# Patient Record
Sex: Female | Born: 1960 | Hispanic: Yes | Marital: Married | Smoking: Never smoker
Health system: Southern US, Community
[De-identification: ages and names within clinical notes are randomized; demographics above are authoritative.]

## PROBLEM LIST (undated history)

## (undated) DIAGNOSIS — R7303 Prediabetes: Secondary | ICD-10-CM

---

## 2015-01-22 DIAGNOSIS — H40023 Open angle with borderline findings, high risk, bilateral: Secondary | ICD-10-CM | POA: Insufficient documentation

## 2015-12-26 DIAGNOSIS — R7303 Prediabetes: Secondary | ICD-10-CM | POA: Insufficient documentation

## 2018-12-19 DIAGNOSIS — F5101 Primary insomnia: Secondary | ICD-10-CM | POA: Insufficient documentation

## 2018-12-19 HISTORY — DX: Primary insomnia: F51.01

## 2020-06-05 ENCOUNTER — Other Ambulatory Visit: Payer: Self-pay

## 2020-06-05 ENCOUNTER — Emergency Department: Payer: 59

## 2020-06-05 ENCOUNTER — Encounter: Payer: Self-pay | Admitting: Emergency Medicine

## 2020-06-05 DIAGNOSIS — J9601 Acute respiratory failure with hypoxia: Secondary | ICD-10-CM | POA: Diagnosis present

## 2020-06-05 DIAGNOSIS — Z283 Underimmunization status: Secondary | ICD-10-CM

## 2020-06-05 DIAGNOSIS — U071 COVID-19: Secondary | ICD-10-CM | POA: Diagnosis not present

## 2020-06-05 DIAGNOSIS — Z83511 Family history of glaucoma: Secondary | ICD-10-CM

## 2020-06-05 DIAGNOSIS — R7303 Prediabetes: Secondary | ICD-10-CM | POA: Diagnosis present

## 2020-06-05 DIAGNOSIS — Z79899 Other long term (current) drug therapy: Secondary | ICD-10-CM

## 2020-06-05 DIAGNOSIS — D696 Thrombocytopenia, unspecified: Secondary | ICD-10-CM | POA: Diagnosis present

## 2020-06-05 DIAGNOSIS — J1282 Pneumonia due to coronavirus disease 2019: Secondary | ICD-10-CM | POA: Diagnosis present

## 2020-06-05 DIAGNOSIS — Z833 Family history of diabetes mellitus: Secondary | ICD-10-CM

## 2020-06-05 DIAGNOSIS — H409 Unspecified glaucoma: Secondary | ICD-10-CM | POA: Diagnosis present

## 2020-06-05 LAB — CBC WITH DIFFERENTIAL/PLATELET
Abs Immature Granulocytes: 0.02 10*3/uL (ref 0.00–0.07)
Basophils Absolute: 0 10*3/uL (ref 0.0–0.1)
Basophils Relative: 0 %
Eosinophils Absolute: 0 10*3/uL (ref 0.0–0.5)
Eosinophils Relative: 0 %
HCT: 39.3 % (ref 36.0–46.0)
Hemoglobin: 13.3 g/dL (ref 12.0–15.0)
Immature Granulocytes: 0 %
Lymphocytes Relative: 33 %
Lymphs Abs: 1.5 10*3/uL (ref 0.7–4.0)
MCH: 30 pg (ref 26.0–34.0)
MCHC: 33.8 g/dL (ref 30.0–36.0)
MCV: 88.7 fL (ref 80.0–100.0)
Monocytes Absolute: 0.3 10*3/uL (ref 0.1–1.0)
Monocytes Relative: 7 %
Neutro Abs: 2.6 10*3/uL (ref 1.7–7.7)
Neutrophils Relative %: 60 %
Platelets: 143 10*3/uL — ABNORMAL LOW (ref 150–400)
RBC: 4.43 MIL/uL (ref 3.87–5.11)
RDW: 11.9 % (ref 11.5–15.5)
WBC: 4.5 10*3/uL (ref 4.0–10.5)
nRBC: 0 % (ref 0.0–0.2)

## 2020-06-05 LAB — COMPREHENSIVE METABOLIC PANEL
ALT: 28 U/L (ref 0–44)
AST: 38 U/L (ref 15–41)
Albumin: 3.6 g/dL (ref 3.5–5.0)
Alkaline Phosphatase: 59 U/L (ref 38–126)
Anion gap: 11 (ref 5–15)
BUN: 13 mg/dL (ref 6–20)
CO2: 30 mmol/L (ref 22–32)
Calcium: 9 mg/dL (ref 8.9–10.3)
Chloride: 97 mmol/L — ABNORMAL LOW (ref 98–111)
Creatinine, Ser: 0.69 mg/dL (ref 0.44–1.00)
GFR calc Af Amer: >60 mL/min (ref >60)
GFR calc non Af Amer: >60 mL/min (ref >60)
Glucose, Bld: 108 mg/dL — ABNORMAL HIGH (ref 70–99)
Potassium: 3.8 mmol/L (ref 3.5–5.1)
Sodium: 138 mmol/L (ref 135–145)
Total Bilirubin: 0.8 mg/dL (ref 0.3–1.2)
Total Protein: 7 g/dL (ref 6.5–8.1)

## 2020-06-05 LAB — TROPONIN I (HIGH SENSITIVITY): Troponin I (High Sensitivity): 4 ng/L (ref <18)

## 2020-06-05 NOTE — ED Triage Notes (Addendum)
Pt presents to ED with chest pain and right sided back pain with frequent cough. Fever does not improve with otc medication. Dx with COVID aug 30. Pt reports feeling so weak she cant walk around and dizzy when she attempts to walk around her house.

## 2020-06-06 ENCOUNTER — Inpatient Hospital Stay
Admission: EM | Admit: 2020-06-06 | Discharge: 2020-06-08 | DRG: 177 | Disposition: A | Discharge status: Home / Self Care | Payer: 59 | Attending: Internal Medicine | Admitting: Internal Medicine

## 2020-06-06 ENCOUNTER — Encounter: Payer: Self-pay | Admitting: Family Medicine

## 2020-06-06 ENCOUNTER — Inpatient Hospital Stay: Payer: 59

## 2020-06-06 DIAGNOSIS — R7303 Prediabetes: Secondary | ICD-10-CM | POA: Diagnosis present

## 2020-06-06 DIAGNOSIS — Z833 Family history of diabetes mellitus: Secondary | ICD-10-CM | POA: Diagnosis not present

## 2020-06-06 DIAGNOSIS — H4010X1 Unspecified open-angle glaucoma, mild stage: Secondary | ICD-10-CM

## 2020-06-06 DIAGNOSIS — U071 COVID-19: Secondary | ICD-10-CM | POA: Diagnosis present

## 2020-06-06 DIAGNOSIS — D696 Thrombocytopenia, unspecified: Secondary | ICD-10-CM | POA: Diagnosis present

## 2020-06-06 DIAGNOSIS — Z83511 Family history of glaucoma: Secondary | ICD-10-CM | POA: Diagnosis not present

## 2020-06-06 DIAGNOSIS — Z283 Underimmunization status: Secondary | ICD-10-CM | POA: Diagnosis not present

## 2020-06-06 DIAGNOSIS — J1282 Pneumonia due to coronavirus disease 2019: Secondary | ICD-10-CM

## 2020-06-06 DIAGNOSIS — J9601 Acute respiratory failure with hypoxia: Secondary | ICD-10-CM | POA: Diagnosis present

## 2020-06-06 DIAGNOSIS — H409 Unspecified glaucoma: Secondary | ICD-10-CM | POA: Diagnosis present

## 2020-06-06 DIAGNOSIS — Z79899 Other long term (current) drug therapy: Secondary | ICD-10-CM | POA: Diagnosis not present

## 2020-06-06 HISTORY — DX: Prediabetes: R73.03

## 2020-06-06 LAB — GLUCOSE, CAPILLARY
Glucose-Capillary: 220 mg/dL — ABNORMAL HIGH (ref 70–99)
Glucose-Capillary: 226 mg/dL — ABNORMAL HIGH (ref 70–99)
Glucose-Capillary: 162 mg/dL — ABNORMAL HIGH (ref 70–99)

## 2020-06-06 LAB — LACTATE DEHYDROGENASE: LDH: 276 U/L — ABNORMAL HIGH (ref 98–192)

## 2020-06-06 LAB — HEMOGLOBIN A1C
Hgb A1c MFr Bld: 6.1 % — ABNORMAL HIGH (ref 4.8–5.6)
Mean Plasma Glucose: 128.37 mg/dL

## 2020-06-06 LAB — TROPONIN I (HIGH SENSITIVITY): Troponin I (High Sensitivity): 4 ng/L (ref <18)

## 2020-06-06 LAB — C-REACTIVE PROTEIN: CRP: 4.7 mg/dL — ABNORMAL HIGH (ref <1.0)

## 2020-06-06 LAB — CK: Total CK: 60 U/L (ref 38–234)

## 2020-06-06 LAB — FIBRIN DERIVATIVES D-DIMER (ARMC ONLY): Fibrin derivatives D-dimer (ARMC): 1637.12 ng/mL (FEU) — ABNORMAL HIGH (ref 0.00–499.00)

## 2020-06-06 LAB — HIV ANTIBODY (ROUTINE TESTING W REFLEX): HIV Screen 4th Generation wRfx: NONREACTIVE

## 2020-06-06 LAB — FERRITIN: Ferritin: 481 ng/mL — ABNORMAL HIGH (ref 11–307)

## 2020-06-06 LAB — PROCALCITONIN: Procalcitonin: <0.1 ng/mL

## 2020-06-06 LAB — BRAIN NATRIURETIC PEPTIDE: B Natriuretic Peptide: 30.3 pg/mL (ref 0.0–100.0)

## 2020-06-06 MED ORDER — DEXAMETHASONE SODIUM PHOSPHATE 10 MG/ML IJ SOLN
10.0000 mg | Freq: Once | INTRAMUSCULAR | Status: AC
  Administered 2020-06-06: 10 mg via INTRAVENOUS
  Filled 2020-06-06: qty 1

## 2020-06-06 MED ORDER — FAMOTIDINE 20 MG PO TABS
20.0000 mg | ORAL_TABLET | Freq: Two times a day (BID) | ORAL | Status: DC
  Administered 2020-06-06 – 2020-06-08 (×5): 20 mg via ORAL
  Filled 2020-06-06 (×5): qty 1

## 2020-06-06 MED ORDER — SODIUM CHLORIDE 0.9 % IV SOLN: 200.0000 mg | Freq: Once | INTRAVENOUS | Status: DC

## 2020-06-06 MED ORDER — SODIUM CHLORIDE 0.9 % IV SOLN: 100.0000 mg | Freq: Every day | INTRAVENOUS | Status: DC

## 2020-06-06 MED ORDER — ASPIRIN EC 81 MG PO TBEC
81.0000 mg | DELAYED_RELEASE_TABLET | Freq: Every day | ORAL | Status: DC
  Administered 2020-06-06 – 2020-06-08 (×3): 81 mg via ORAL
  Filled 2020-06-06 (×3): qty 1

## 2020-06-06 MED ORDER — MAGNESIUM HYDROXIDE 400 MG/5ML PO SUSP
30.0000 mL | Freq: Every day | ORAL | Status: DC | PRN
  Filled 2020-06-06: qty 30

## 2020-06-06 MED ORDER — INSULIN ASPART 100 UNIT/ML ~~LOC~~ SOLN
0.0000 [IU] | Freq: Three times a day (TID) | SUBCUTANEOUS | Status: DC
  Administered 2020-06-06: 4 [IU] via SUBCUTANEOUS
  Administered 2020-06-07 (×2): 3 [IU] via SUBCUTANEOUS
  Administered 2020-06-07: 7 [IU] via SUBCUTANEOUS
  Administered 2020-06-08 (×2): 4 [IU] via SUBCUTANEOUS
  Filled 2020-06-06 (×6): qty 1

## 2020-06-06 MED ORDER — SODIUM CHLORIDE 0.9 % IV SOLN
1.0000 g | Freq: Once | INTRAVENOUS | Status: AC
  Administered 2020-06-06: 1 g via INTRAVENOUS
  Filled 2020-06-06: qty 10

## 2020-06-06 MED ORDER — LATANOPROST 0.005 % OP SOLN
1.0000 [drp] | Freq: Every day | OPHTHALMIC | Status: DC
  Filled 2020-06-06: qty 2.5

## 2020-06-06 MED ORDER — VITAMIN D 25 MCG (1000 UNIT) PO TABS
1000.0000 [IU] | ORAL_TABLET | Freq: Every day | ORAL | Status: DC
  Administered 2020-06-06 – 2020-06-08 (×3): 1000 [IU] via ORAL
  Filled 2020-06-06 (×3): qty 1

## 2020-06-06 MED ORDER — LATANOPROST 0.005 % OP SOLN
1.0000 [drp] | Freq: Every day | OPHTHALMIC | Status: DC
  Administered 2020-06-06 – 2020-06-07 (×2): 1 [drp] via OPHTHALMIC
  Filled 2020-06-06: qty 2.5

## 2020-06-06 MED ORDER — ZINC SULFATE 220 (50 ZN) MG PO CAPS
220.0000 mg | ORAL_CAPSULE | Freq: Every day | ORAL | Status: DC
  Administered 2020-06-06 – 2020-06-08 (×3): 220 mg via ORAL
  Filled 2020-06-06 (×3): qty 1

## 2020-06-06 MED ORDER — PREDNISONE 50 MG PO TABS: 50.0000 mg | ORAL_TABLET | Freq: Every day | ORAL | Status: DC

## 2020-06-06 MED ORDER — ENOXAPARIN SODIUM 40 MG/0.4ML ~~LOC~~ SOLN
40.0000 mg | SUBCUTANEOUS | Status: DC
  Administered 2020-06-06 – 2020-06-07 (×3): 40 mg via SUBCUTANEOUS
  Filled 2020-06-06 (×3): qty 0.4

## 2020-06-06 MED ORDER — ASCORBIC ACID 500 MG PO TABS
500.0000 mg | ORAL_TABLET | Freq: Every day | ORAL | Status: DC
  Administered 2020-06-06 – 2020-06-08 (×3): 500 mg via ORAL
  Filled 2020-06-06 (×3): qty 1

## 2020-06-06 MED ORDER — SODIUM CHLORIDE 0.9 % IV SOLN
500.0000 mg | Freq: Once | INTRAVENOUS | Status: AC
  Administered 2020-06-06: 500 mg via INTRAVENOUS
  Filled 2020-06-06: qty 500

## 2020-06-06 MED ORDER — GUAIFENESIN ER 600 MG PO TB12
600.0000 mg | ORAL_TABLET | Freq: Two times a day (BID) | ORAL | Status: DC
  Administered 2020-06-06 – 2020-06-08 (×5): 600 mg via ORAL
  Filled 2020-06-06 (×5): qty 1

## 2020-06-06 MED ORDER — SODIUM CHLORIDE 0.9 % IV SOLN
200.0000 mg | Freq: Once | INTRAVENOUS | Status: AC
  Administered 2020-06-06: 200 mg via INTRAVENOUS
  Filled 2020-06-06: qty 200

## 2020-06-06 MED ORDER — SODIUM CHLORIDE 0.9 % IV SOLN
100.0000 mg | Freq: Every day | INTRAVENOUS | Status: DC
  Administered 2020-06-07 – 2020-06-08 (×2): 100 mg via INTRAVENOUS
  Filled 2020-06-06 (×2): qty 20

## 2020-06-06 MED ORDER — BARICITINIB 2 MG PO TABS
4.0000 mg | ORAL_TABLET | Freq: Every day | ORAL | Status: DC
  Administered 2020-06-06 – 2020-06-07 (×2): 4 mg via ORAL
  Filled 2020-06-06 (×3): qty 2

## 2020-06-06 MED ORDER — IOHEXOL 350 MG/ML SOLN
100.0000 mL | Freq: Once | INTRAVENOUS | Status: AC | PRN
  Administered 2020-06-06: 100 mL via INTRAVENOUS

## 2020-06-06 MED ORDER — ONDANSETRON HCL 4 MG/2ML IJ SOLN: 4.0000 mg | Freq: Four times a day (QID) | INTRAMUSCULAR | Status: DC | PRN

## 2020-06-06 MED ORDER — INSULIN ASPART 100 UNIT/ML ~~LOC~~ SOLN
0.0000 [IU] | Freq: Three times a day (TID) | SUBCUTANEOUS | Status: DC
  Administered 2020-06-06: 3 [IU] via SUBCUTANEOUS
  Filled 2020-06-06: qty 1

## 2020-06-06 MED ORDER — SODIUM CHLORIDE 0.9 % IV SOLN: INTRAVENOUS | Status: DC

## 2020-06-06 MED ORDER — SODIUM CHLORIDE 0.9 % IV BOLUS
1000.0000 mL | Freq: Once | INTRAVENOUS | Status: AC
  Administered 2020-06-06: 1000 mL via INTRAVENOUS

## 2020-06-06 MED ORDER — GUAIFENESIN-DM 100-10 MG/5ML PO SYRP
10.0000 mL | ORAL_SOLUTION | ORAL | Status: DC | PRN
  Administered 2020-06-07: 10 mL via ORAL
  Filled 2020-06-06 (×2): qty 10

## 2020-06-06 MED ORDER — TRAZODONE HCL 50 MG PO TABS: 25.0000 mg | ORAL_TABLET | Freq: Every evening | ORAL | Status: DC | PRN

## 2020-06-06 MED ORDER — METHYLPREDNISOLONE SODIUM SUCC 125 MG IJ SOLR
1.0000 mg/kg | Freq: Two times a day (BID) | INTRAMUSCULAR | Status: DC
  Administered 2020-06-06 – 2020-06-08 (×5): 71.875 mg via INTRAVENOUS
  Filled 2020-06-06 (×5): qty 2

## 2020-06-06 MED ORDER — HYDROCOD POLST-CPM POLST ER 10-8 MG/5ML PO SUER
5.0000 mL | Freq: Two times a day (BID) | ORAL | Status: DC | PRN
  Administered 2020-06-06: 5 mL via ORAL
  Filled 2020-06-06: qty 5

## 2020-06-06 MED ORDER — ACETAMINOPHEN 325 MG PO TABS: 650.0000 mg | ORAL_TABLET | Freq: Four times a day (QID) | ORAL | Status: DC | PRN

## 2020-06-06 MED ORDER — ONDANSETRON HCL 4 MG PO TABS: 4.0000 mg | ORAL_TABLET | Freq: Four times a day (QID) | ORAL | Status: DC | PRN

## 2020-06-06 NOTE — Progress Notes (Signed)
Remdesivir - Pharmacy Brief Note   O:  CXR: IMPRESSION: Patchy nodular infiltrates in the right lung and left lower lung compatible with COVID pneumonia. SpO2: 98 - 95% on RA   A/P:  Remdesivir 200 mg IVPB once followed by 100 mg IVPB daily x 4 days.   Thomasene Ripple, PharmD, BCPS Clinical Pharmacist 06/06/2020 2:51 AM

## 2020-06-06 NOTE — ED Notes (Signed)
Pt ambulated with pulse ox per Dr. Manson Passey verbal order, patient O2 sat dropped to 87% and RR went to the 40's while ambulating. Patient back in bed at this time, placed on 3 L of oxygen.

## 2020-06-06 NOTE — H&P (Signed)
[place]   PATIENT NAME: Carolyn Obrien    MR#:  [id number]  DATE OF BIRTH:  Feb 23, 1961  DATE OF ADMISSION:  06/06/2020  PRIMARY CARE PHYSICIAN: Clent Jacks, PA-C   REQUESTING/REFERRING PHYSICIAN: Bayard Males, MD  CHIEF COMPLAINT:   Chief Complaint  Patient presents with  . Cough    HISTORY OF PRESENT ILLNESS:  Carolyn Obrien  is a 59 y.o. female with a known history of prediabetes, who presented to the emergency room with acute onset of worsening dyspnea with associated cough which has been mainly dry and she has not been able to expectorate with occasional wheezing for the last 10 days.  She had nausea without vomiting or diarrhea.  Her dyspnea has been worsening over the last 3 days.  She admitted to loss of taste and smell as well as body aches in addition to fever and chills.  She denies any chest pain or palpitations.  No rhinorrhea or nasal congestion or sore throat or earache.  Upon presentation to the emergency room, respiratory rate was 24 and temperature was 99.6.  The patient's pulse oximetry has dropped to 87% on room air when she was just getting off of her bed.  Her CMP is unremarkable and high-sensitivity troponin I was 4 and later the same with a CK of 60.  CBC was unremarkable except for mild thrombocytopenia. EKG showed normal sinus rhythm with a rate of 78 with poor R wave progression.  Two-view chest x-ray showed patchy nodular infiltrates in the right lung in the left lower lung compatible with Covid pneumonia.  The patient was given IV Rocephin and Zithromax as well as IV Decadron and remdesivir.  She will be admitted to a medically monitored bed for further evaluation and management.  PAST MEDICAL HISTORY:   Past Medical History:  Diagnosis Date  . Prediabetes     PAST SURGICAL HISTORY:  3 C-sections and salpingectomy  SOCIAL HISTORY:   Social History   Tobacco Use  . Smoking status: Never Smoker  . Smokeless tobacco: Never Used    Substance Use Topics  . Alcohol use: Not Currently    FAMILY HISTORY:  Positive for diabetes mellitus and glaucoma in her father.  DRUG ALLERGIES:  No Known Allergies  REVIEW OF SYSTEMS:   ROS As per history of present illness. All pertinent systems were reviewed above. Constitutional, HEENT, cardiovascular, respiratory, GI, GU, musculoskeletal, neuro, psychiatric, endocrine, integumentary and hematologic systems were reviewed and are otherwise negative/unremarkable except for positive findings mentioned above in the HPI.   MEDICATIONS AT HOME:   Prior to Admission medications   Medication Sig Start Date End Date Taking? Authorizing Provider  chlorpheniramine-HYDROcodone (TUSSIONEX) 10-8 MG/5ML SUER Take 5 mLs by mouth at bedtime as needed. 06/01/20   [provider]  latanoprost (XALATAN) 0.005 % ophthalmic solution Place 1 drop into both eyes at bedtime. 04/16/20   [provider]      VITAL SIGNS:  Blood pressure 133/87, pulse 84, temperature 99.6 F (37.6 C), temperature source Oral, resp. rate (!) 22, height 5\' 4"  (1.626 m), weight 72 kg, SpO2 96 %.  PHYSICAL EXAMINATION:  Physical Exam  GENERAL:  59 y.o.-year-old female patient lying in the bed with mild respiratory distress with conversational dyspnea due to excessive cough. EYES: Pupils equal, round, reactive to light and accommodation. No scleral icterus. Extraocular muscles intact.  HEENT: Head atraumatic, normocephalic. Oropharynx and nasopharynx clear.  NECK:  Supple, no jugular venous distention. No thyroid enlargement,  no tenderness.  LUNGS: Diminished bibasal breath sounds with bibasal crackles. CARDIOVASCULAR: Regular rate and rhythm, S1, S2 normal. No murmurs, rubs, or gallops.  ABDOMEN: Soft, nondistended, nontender. Bowel sounds present. No organomegaly or mass.  EXTREMITIES: No pedal edema, cyanosis, or clubbing.  NEUROLOGIC: Cranial nerves II through XII are intact. Muscle strength 5/5 in  all extremities. Sensation intact. Gait not checked.  PSYCHIATRIC: The patient is alert and oriented x 3.  Normal affect and good eye contact. SKIN: No obvious rash, lesion, or ulcer.   LABORATORY PANEL:   CBC Recent Labs  Lab 06/05/20 2141  WBC 4.5  HGB 13.3  HCT 39.3  PLT 143*   ------------------------------------------------------------------------------------------------------------------  Chemistries  Recent Labs  Lab 06/05/20 2141  NA 138  K 3.8  CL 97*  CO2 30  GLUCOSE 108*  BUN 13  CREATININE 0.69  CALCIUM 9.0  AST 38  ALT 28  ALKPHOS 59  BILITOT 0.8   ------------------------------------------------------------------------------------------------------------------  Cardiac Enzymes No results for input(s): TROPONINI in the last 168 hours. ------------------------------------------------------------------------------------------------------------------  RADIOLOGY:  DG Chest 2 View  Result Date: 06/05/2020 CLINICAL DATA:  COVID positive patient with cough and fever. Weakness and dizziness. EXAM: CHEST - 2 VIEW COMPARISON:  None. FINDINGS: Heart size and pulmonary vascularity are normal. Patchy nodular infiltrates in the right lung and left lower lung with peribronchial thickening. Appearances are compatible with COVID pneumonia. No pleural effusions. No pneumothorax. Mediastinal contours appear intact. IMPRESSION: Patchy nodular infiltrates in the right lung and left lower lung compatible with COVID pneumonia. Electronically Signed   By: Burman Nieves M.D.   On: 06/05/2020 22:09      IMPRESSION AND PLAN:   1.  Acute hypoxemic respiratory failure secondary to COVID-19. -The patient will be admitted to a medically monitored isolation bed. -O2 protocol will be followed to keep O2 saturation above 93.   2.  Multifocal pneumonia secondary to COVID-19. -The patient will be admitted to an isolation monitored bed with droplet and contact precautions. -Given  multifocal pneumonia we will empirically place the patient on IV Rocephin and Zithromax for possible bacterial superinfection only with elevated Procalcitonin. -The patient will be placed on scheduled Mucinex and as needed Tussionex. -We will avoid nebulization as much as we can, give bronchodilator MDI if needed, and with deterioration of oxygenation try to avoid BiPAP/CPAP if possible.    -Will obtain sputum Gram stain culture and sensitivity and follow blood cultures. -O2 protocol will be followed. -We will follow CRP, ferritin, LDH and D-dimer. -Will follow manual differential for ANC/ALC ratio as well as follow troponin I and daily CBC with manual differential and CMP. - Will place the patient on IV Remdesivir and IV steroid therapy with Decadron with elevated inflammatory markers. -The patient will be placed on vitamin D3, vitamin C, zinc sulfate, p.o. Pepcid and aspirin. -I discussed baricitinib with the patient and she agreed to proceed with it.  3.  Prediabetes. -We will monitor fingerstick blood glucose measures.  4.  Glaucoma. -We will continue her Xalatan ophthalmic gtt.  5.  DVT prophylaxis. -Subcutaneous Lovenox.  All the records are reviewed and case discussed with ED provider. The plan of care was discussed in details with the patient (and family). I answered all questions. The patient agreed to proceed with the above mentioned plan. Further management will depend upon hospital course.   CODE STATUS: Full code  Status is: Inpatient  Remains inpatient appropriate because:Ongoing diagnostic testing needed not appropriate for outpatient work up, Unsafe d/c  plan, IV treatments appropriate due to intensity of illness or inability to take PO and Inpatient level of care appropriate due to severity of illness   Dispo: The patient is from: Home              Anticipated d/c is to: Home              Anticipated d/c date is: 3 days              Patient currently is not  medically stable to d/c.   TOTAL TIME TAKING CARE OF THIS PATIENT: 55 minutes.    Hannah Beat M.D on 06/06/2020 at 3:49 AM  [facility]   From 7 PM-7 AM, contact night-coverage [email or web address]  CC: Primary care physician; Clent Jacks, PA-C   Note: This dictation was prepared with Dragon dictation along with smaller phrase technology. Any transcriptional typo errors that result from this process are unintentional.

## 2020-06-06 NOTE — ED Provider Notes (Addendum)
[facility] Emergency Department Provider Note  ____________________________________________   First MD Initiated Contact with Patient 06/06/20 [id number]     (approximate)  I have reviewed the triage vital signs and the nursing notes.   HISTORY  Chief Complaint Cough    HPI Carolyn Obrien is a 59 y.o. female with history of diabetes recently diagnosed over 27 on 05/26/2020 presents to the emergency department secondary to worsening dyspnea fatigue, persistent fever, poor p.o. intake.  Patient states oxygen saturations at home 90% on home pulse ox.  Patient states that dyspnea worsened with even minimal activity.  Patient is unvaccinated.        Past Medical History:  Diagnosis Date  . Prediabetes     There are no problems to display for this patient.   History reviewed. No pertinent surgical history.  Prior to Admission medications   Not on File    Allergies Patient has no known allergies.  No family history on file.  Social History Social History   Tobacco Use  . Smoking status: Never Smoker  . Smokeless tobacco: Never Used  Vaping Use  . Vaping Use: Never used  Substance Use Topics  . Alcohol use: Not Currently  . Drug use: Not Currently    Review of Systems Constitutional: Positive for fever/chills Eyes: No visual changes. ENT: No sore throat. Cardiovascular: Denies chest pain. Respiratory: Positive for shortness of breath.  Positive for cough Gastrointestinal: No abdominal pain.  No nausea, no vomiting.  No diarrhea.  No constipation. Genitourinary: Negative for dysuria. Musculoskeletal: Negative for neck pain.  Negative for back pain. Integumentary: Negative for rash. Neurological: Negative for headaches, focal weakness or numbness.   ____________________________________________   PHYSICAL EXAM:  VITAL SIGNS: ED Triage Vitals  Enc Vitals Group     BP 06/05/20 2118 118/81     Pulse Rate 06/05/20 2118 89     Resp  06/05/20 2118 (!) 24     Temp 06/05/20 2118 99.6 F (37.6 C)     Temp Source 06/05/20 2118 Oral     SpO2 06/05/20 2118 95 %     Weight 06/05/20 2120 72 kg (158 lb 11.7 oz)     Height 06/05/20 2120 1.626 m (5\' 4" )     Head Circumference --      Peak Flow --      Pain Score 06/05/20 2119 8     Pain Loc --      Pain Edu? --      Excl. in GC? --     Constitutional: Alert and oriented.  Eyes: Conjunctivae are normal.  Head: Atraumatic. Mouth/Throat: Patient is wearing a mask. Neck: No stridor.  No meningeal signs.   Cardiovascular: Normal rate, regular rhythm. Good peripheral circulation. Grossly normal heart sounds. Respiratory: Tachypnea, diffuse rhonchi. Gastrointestinal: Soft and nontender. No distention.  Musculoskeletal: No lower extremity tenderness nor edema. No gross deformities of extremities. Neurologic:  Normal speech and language. No gross focal neurologic deficits are appreciated.  Skin:  Skin is warm, dry and intact. Psychiatric: Mood and affect are normal. Speech and behavior are normal.  ____________________________________________   LABS (all labs ordered are listed, but only abnormal results are displayed)  Labs Reviewed  CBC WITH DIFFERENTIAL/PLATELET - Abnormal; Notable for the following components:      Result Value   Platelets 143 (*)    All other components within normal limits  COMPREHENSIVE METABOLIC PANEL - Abnormal; Notable for the following components:   Chloride 97 (*)  Glucose, Bld 108 (*)    All other components within normal limits  CK  TROPONIN I (HIGH SENSITIVITY)  TROPONIN I (HIGH SENSITIVITY)   ____________________________________________  EKG  ED ECG REPORT I, [place] N Kyesha Balla, the attending physician, personally viewed and interpreted this ECG.   Date: 06/06/2020  EKG Time: 9:30 PM  Rate: 78  Rhythm: Normal sinus rhythm  Axis: Normal  Intervals: Normal  ST&T Change:  None  ____________________________________________  RADIOLOGY I, Kermit N Chase Knebel, personally viewed and evaluated these images (plain radiographs) as part of my medical decision making, as well as reviewing the written report by the radiologist.  ED MD interpretation: Patchy nodular infiltrates in the right lung and left lower lung compatible Covid pneumonia.  Official radiology report(s): DG Chest 2 View  Result Date: 06/05/2020 CLINICAL DATA:  COVID positive patient with cough and fever. Weakness and dizziness. EXAM: CHEST - 2 VIEW COMPARISON:  None. FINDINGS: Heart size and pulmonary vascularity are normal. Patchy nodular infiltrates in the right lung and left lower lung with peribronchial thickening. Appearances are compatible with COVID pneumonia. No pleural effusions. No pneumothorax. Mediastinal contours appear intact. IMPRESSION: Patchy nodular infiltrates in the right lung and left lower lung compatible with COVID pneumonia. Electronically Signed   By: Burman Nieves M.D.   On: 06/05/2020 22:09     Procedures   ____________________________________________   INITIAL IMPRESSION / MDM / ASSESSMENT AND PLAN / ED COURSE  As part of my medical decision making, I reviewed the following data within the electronic MEDICAL RECORD NUMBER   59 year old female presented with above-stated history and physical exam consistent with Covid pneumonia which is noted on chest x-ray.  Patient's ambulatory (minimal) oxygen saturation 87%.  Patient given Decadron 10 mg IV remdesivir ordered.  Patient will be admitted to hospitalist for further evaluation and management.  ____________________________________________  FINAL CLINICAL IMPRESSION(S) / ED DIAGNOSES  Final diagnoses:  Pneumonia due to COVID-19 virus     MEDICATIONS GIVEN DURING THIS VISIT:  Medications  remdesivir 200 mg in sodium chloride 0.9% 250 mL IVPB (has no administration in time range)    Followed by  remdesivir 100 mg in  sodium chloride 0.9 % 100 mL IVPB (has no administration in time range)  cefTRIAXone (ROCEPHIN) 1 g in sodium chloride 0.9 % 100 mL IVPB (has no administration in time range)  azithromycin (ZITHROMAX) 500 mg in sodium chloride 0.9 % 250 mL IVPB (has no administration in time range)  dexamethasone (DECADRON) injection 10 mg (10 mg Intravenous Given 06/06/20 0253)  sodium chloride 0.9 % bolus 1,000 mL (1,000 mLs Intravenous New Bag/Given 06/06/20 0252)     ED Discharge Orders    None      *Please note:  Wealthy Danielski was evaluated in Emergency Department on 06/06/2020 for the symptoms described in the history of present illness. She was evaluated in the context of the global COVID-19 pandemic, which necessitated consideration that the patient might be at risk for infection with the SARS-CoV-2 virus that causes COVID-19. Institutional protocols and algorithms that pertain to the evaluation of patients at risk for COVID-19 are in a state of rapid change based on information released by regulatory bodies including the CDC and federal and state organizations. These policies and algorithms were followed during the patient's care in the ED.  Some ED evaluations and interventions may be delayed as a result of limited staffing during and after the pandemic.*  Note:  This document was prepared using Conservation officer, historic buildings  and may include unintentional dictation errors.   Darci Current, MD 06/06/20 0308    Darci Current, MD 06/06/20 0309    Darci Current, MD 06/06/20 [phone]

## 2020-06-06 NOTE — Progress Notes (Addendum)
Brief hospitalist update note.  This is a same-day progress note.  Nonbillable note Please see same-day H&P for full billable details.  Briefly, 59 year old female in relatively good level health, known history of prediabetes presented with symptoms concerning for Covid pneumonia.  Presentation patient was hypoxic requiring supplemental oxygen and tachypneic.  Symptoms slowly improving with IV steroids and supplemental oxygen.  At time my evaluation patient remains on 2 L.  She is mentating clearly.  Normal work of breathing.  Continue plan of care as detailed in HPI.  Attempted to call and update patient spouse Lucrecia Mcphearson via phone.  Number listed in chart [phone] does not appear to be an accurate number.  Voicemail listed a different name.    Lolita Patella MD

## 2020-06-07 ENCOUNTER — Encounter: Payer: Self-pay | Admitting: Family Medicine

## 2020-06-07 ENCOUNTER — Other Ambulatory Visit: Payer: Self-pay

## 2020-06-07 LAB — CBC WITH DIFFERENTIAL/PLATELET
Abs Immature Granulocytes: 0.02 10*3/uL (ref 0.00–0.07)
Basophils Absolute: 0 10*3/uL (ref 0.0–0.1)
Basophils Relative: 0 %
Eosinophils Absolute: 0 10*3/uL (ref 0.0–0.5)
Eosinophils Relative: 0 %
HCT: 35 % — ABNORMAL LOW (ref 36.0–46.0)
Hemoglobin: 12 g/dL (ref 12.0–15.0)
Immature Granulocytes: 0 %
Lymphocytes Relative: 22 %
Lymphs Abs: 1.4 10*3/uL (ref 0.7–4.0)
MCH: 29.6 pg (ref 26.0–34.0)
MCHC: 34.3 g/dL (ref 30.0–36.0)
MCV: 86.4 fL (ref 80.0–100.0)
Monocytes Absolute: 0.3 10*3/uL (ref 0.1–1.0)
Monocytes Relative: 5 %
Neutro Abs: 4.5 10*3/uL (ref 1.7–7.7)
Neutrophils Relative %: 73 %
Platelets: 166 10*3/uL (ref 150–400)
RBC: 4.05 MIL/uL (ref 3.87–5.11)
RDW: 11.9 % (ref 11.5–15.5)
WBC: 6.2 10*3/uL (ref 4.0–10.5)
nRBC: 0 % (ref 0.0–0.2)

## 2020-06-07 LAB — COMPREHENSIVE METABOLIC PANEL
ALT: 27 U/L (ref 0–44)
AST: 30 U/L (ref 15–41)
Albumin: 3 g/dL — ABNORMAL LOW (ref 3.5–5.0)
Alkaline Phosphatase: 56 U/L (ref 38–126)
Anion gap: 9 (ref 5–15)
BUN: 16 mg/dL (ref 6–20)
CO2: 26 mmol/L (ref 22–32)
Calcium: 9 mg/dL (ref 8.9–10.3)
Chloride: 104 mmol/L (ref 98–111)
Creatinine, Ser: 0.55 mg/dL (ref 0.44–1.00)
GFR calc Af Amer: >60 mL/min (ref >60)
GFR calc non Af Amer: >60 mL/min (ref >60)
Glucose, Bld: 172 mg/dL — ABNORMAL HIGH (ref 70–99)
Potassium: 3.7 mmol/L (ref 3.5–5.1)
Sodium: 139 mmol/L (ref 135–145)
Total Bilirubin: 0.6 mg/dL (ref 0.3–1.2)
Total Protein: 6.1 g/dL — ABNORMAL LOW (ref 6.5–8.1)

## 2020-06-07 LAB — GLUCOSE, CAPILLARY
Glucose-Capillary: 160 mg/dL — ABNORMAL HIGH (ref 70–99)
Glucose-Capillary: 239 mg/dL — ABNORMAL HIGH (ref 70–99)
Glucose-Capillary: 131 mg/dL — ABNORMAL HIGH (ref 70–99)
Glucose-Capillary: 155 mg/dL — ABNORMAL HIGH (ref 70–99)

## 2020-06-07 LAB — FIBRIN DERIVATIVES D-DIMER (ARMC ONLY): Fibrin derivatives D-dimer (ARMC): 2309.52 ng/mL (FEU) — ABNORMAL HIGH (ref 0.00–499.00)

## 2020-06-07 LAB — C-REACTIVE PROTEIN: CRP: 3.2 mg/dL — ABNORMAL HIGH (ref <1.0)

## 2020-06-07 LAB — FERRITIN: Ferritin: 513 ng/mL — ABNORMAL HIGH (ref 11–307)

## 2020-06-07 MED ORDER — ORAL CARE MOUTH RINSE
15.0000 mL | Freq: Two times a day (BID) | OROMUCOSAL | Status: DC
  Administered 2020-06-07 – 2020-06-08 (×3): 15 mL via OROMUCOSAL

## 2020-06-07 NOTE — Progress Notes (Signed)
PROGRESS NOTE    Carolyn Obrien  IYM:[id number] DOB: 1961/05/18 DOA: 06/06/2020 PCP: Clent Jacks, PA-C  Brief Narrative:  59 year old female in relatively good level health, known history of prediabetes presented with symptoms concerning for Covid pneumonia.  Presentation patient was hypoxic requiring supplemental oxygen and tachypneic.  Symptoms slowly improving with IV steroids and supplemental oxygen.  At time my evaluation patient remains on 2 L.  She is mentating clearly.  Normal work of breathing  9/11: Patient seen and examined.  Respiratory status about stable from yesterday.  Remains on 2 L nasal cannula.  Reports that she was able to get up and brush her teeth but became short of breath when doing so.  Hemodynamically stable.  Mentating clearly.  Normal work of breathing.   Assessment & Plan:   Active Problems:   Pneumonia due to COVID-19 virus  Multifocal pneumonia secondary to COVID-19 Mild acute hypoxic respiratory failure secondary to above Patient is unvaccinated Appears to be improving over interval On 2 L nasal cannula Downtrending inflammatory markers Plan: DC baricitinib Continue remdesivir Continue steroids Stressed I-S and flutter valve use Prone as tolerated Supplemental vitamins Wean oxygen as tolerated Patient's respiratory status continues to improve anticipate discharge home on 06/08/2020  Prediabetes Monitor fingersticks  Glaucoma Ophthalmic GTT  DVT prophylaxis: Lovenox Code Status: Full Family Communication: Husband Charles Copper via phone [phone] on 06/07/2020 Disposition Plan: Status is: Inpatient  Remains inpatient appropriate because:Inpatient level of care appropriate due to severity of illness   Dispo: The patient is from: Home              Anticipated d/c is to: Home              Anticipated d/c date is: 1 day              Patient currently is not medically stable to d/c.   Patient respiratory status appears to be improving.   Anticipate 1 additional day of inpatient treatment and monitoring.  Tentative plan to discharge home on 06/08/2020      Consultants:   None  Procedures:   None  Antimicrobials:   Remdesivir   Subjective: Seen and examined.  Endorsing shortness of breath.  No other complaints.  Objective: Vitals:   06/06/20 2230 06/06/20 2353 06/07/20 0442 06/07/20 0756  BP: 98/60 108/66 104/64 110/73  Pulse: (!) 59 (!) 56 (!) 56 (!) 55  Resp:  19 20 20   Temp: 99 F (37.2 C) 98.4 F (36.9 C) 97.8 F (36.6 C) 98 F (36.7 C)  TempSrc: Oral Oral Oral Oral  SpO2: 97% 96% 100% 97%  Weight:      Height:       No intake or output data in the 24 hours ending 06/07/20 1101 Filed Weights   06/05/20 2120  Weight: 72 kg    Examination:  General exam: Appears calm and comfortable  Respiratory system: Scattered crackles bilaterally.  Normal work of breathing.  2 L  cardiovascular system: S1 & S2 heard, RRR. No JVD, murmurs, rubs, gallops or clicks. No pedal edema. Gastrointestinal system: Abdomen is nondistended, soft and nontender. No organomegaly or masses felt. Normal bowel sounds heard. Central nervous system: Alert and oriented. No focal neurological deficits. Extremities: Symmetric 5 x 5 power. Skin: No rashes, lesions or ulcers Psychiatry: Judgement and insight appear normal. Mood & affect appropriate.     Data Reviewed: I have personally reviewed following labs and imaging studies  CBC: Recent Labs  Lab 06/05/20 2141 06/07/20  0544  WBC 4.5 6.2  NEUTROABS 2.6 4.5  HGB 13.3 12.0  HCT 39.3 35.0*  MCV 88.7 86.4  PLT 143* 166   Basic Metabolic Panel: Recent Labs  Lab 06/05/20 2141 06/07/20 0544  NA 138 139  K 3.8 3.7  CL 97* 104  CO2 30 26  GLUCOSE 108* 172*  BUN 13 16  CREATININE 0.69 0.55  CALCIUM 9.0 9.0   GFR: Estimated Creatinine Clearance: 74.5 mL/min (by C-G formula based on SCr of 0.55 mg/dL). Liver Function Tests: Recent Labs  Lab 06/05/20 2141  06/07/20 0544  AST 38 30  ALT 28 27  ALKPHOS 59 56  BILITOT 0.8 0.6  PROT 7.0 6.1*  ALBUMIN 3.6 3.0*   No results for input(s): LIPASE, AMYLASE in the last 168 hours. No results for input(s): AMMONIA in the last 168 hours. Coagulation Profile: No results for input(s): INR, PROTIME in the last 168 hours. Cardiac Enzymes: Recent Labs  Lab 06/05/20 2141  CKTOTAL 60   BNP (last 3 results) No results for input(s): PROBNP in the last 8760 hours. HbA1C: Recent Labs    06/05/20 2141  HGBA1C 6.1*   CBG: Recent Labs  Lab 06/06/20 1214 06/06/20 1937 06/06/20 2118 06/07/20 0801  GLUCAP 220* 226* 162* 160*   Lipid Profile: No results for input(s): CHOL, HDL, LDLCALC, TRIG, CHOLHDL, LDLDIRECT in the last 72 hours. Thyroid Function Tests: No results for input(s): TSH, T4TOTAL, FREET4, T3FREE, THYROIDAB in the last 72 hours. Anemia Panel: Recent Labs    06/06/20 0357 06/07/20 0544  FERRITIN 481* 513*   Sepsis Labs: Recent Labs  Lab 06/06/20 0357  PROCALCITON <0.10    No results found for this or any previous visit (from the past 240 hour(s)).       Radiology Studies: DG Chest 2 View  Result Date: 06/05/2020 CLINICAL DATA:  COVID positive patient with cough and fever. Weakness and dizziness. EXAM: CHEST - 2 VIEW COMPARISON:  None. FINDINGS: Heart size and pulmonary vascularity are normal. Patchy nodular infiltrates in the right lung and left lower lung with peribronchial thickening. Appearances are compatible with COVID pneumonia. No pleural effusions. No pneumothorax. Mediastinal contours appear intact. IMPRESSION: Patchy nodular infiltrates in the right lung and left lower lung compatible with COVID pneumonia. Electronically Signed   By: Burman Nieves M.D.   On: 06/05/2020 22:09   CT ANGIO CHEST PE W OR WO CONTRAST  Result Date: 06/06/2020 CLINICAL DATA:  Shortness of breath, COVID-19 positive EXAM: CT ANGIOGRAPHY CHEST WITH CONTRAST TECHNIQUE: Multidetector CT  imaging of the chest was performed using the standard protocol during bolus administration of intravenous contrast. Multiplanar CT image reconstructions and MIPs were obtained to evaluate the vascular anatomy. CONTRAST:  OMNIPAQUE IOHEXOL 350 MG/ML SOLN COMPARISON:  Chest x-ray 06/05/2020 FINDINGS: Cardiovascular: Satisfactory opacification of the pulmonary arteries to the segmental level. No evidence of pulmonary embolism. Thoracic aorta is normal in course and caliber. Normal heart size. No pericardial effusion. Mediastinum/Nodes: No enlarged mediastinal, hilar, or axillary lymph nodes. Thyroid gland, trachea, and esophagus demonstrate no significant findings. Lungs/Pleura: Scattered patchy airspace consolidations throughout both lungs in a predominantly peripheral distribution. Findings are most pronounced within the right upper lobe. No pleural effusion or pneumothorax. Upper Abdomen: Small sliding type hiatal hernia. 1.0 cm low-density left adrenal gland nodule with 7 HU of internal density most likely representing an adrenal adenoma. No acute findings within the visualized upper abdomen. Musculoskeletal: Intraosseous hemangioma noted within the T9 and T10 vertebral bodies. No acute  osseous findings. No suspicious bone lesion. No chest wall abnormality. Review of the MIP images confirms the above findings. IMPRESSION: 1. No evidence of acute pulmonary embolism. 2. Scattered patchy airspace consolidations throughout both lungs in a predominantly peripheral distribution, consistent with multifocal pneumonia in the setting of known COVID-19 infection. 3. Small sliding type hiatal hernia. 4. 1.0 cm left adrenal gland nodule favored to represent a benign adrenal adenoma. No dedicated follow-up imaging is recommended. This recommendation follows ACR consensus guidelines: Management of Incidental Adrenal Masses: A White Paper of the ACR Incidental Findings Committee. J Am Coll Radiol 2017;14:1038-1044.  Electronically Signed   By: Duanne Guess D.O.   On: 06/06/2020 08:27        Scheduled Meds: . vitamin C  500 mg Oral Daily  . aspirin EC  81 mg Oral Daily  . baricitinib  4 mg Oral Daily  . cholecalciferol  1,000 Units Oral Daily  . enoxaparin (LOVENOX) injection  40 mg Subcutaneous Q24H  . famotidine  20 mg Oral BID  . guaiFENesin  600 mg Oral BID  . insulin aspart  0-20 Units Subcutaneous TID AC & HS  . latanoprost  1 drop Both Eyes QHS  . mouth rinse  15 mL Mouth Rinse BID  . methylPREDNISolone (SOLU-MEDROL) injection  1 mg/kg Intravenous Q12H   Followed by  . [START ON 06/09/2020] predniSONE  50 mg Oral Daily  . zinc sulfate  220 mg Oral Daily   Continuous Infusions: . remdesivir 100 mg in NS 100 mL 100 mg (06/07/20 0932)     LOS: 1 day    Time spent: 25 minutes    Tresa Moore, MD [facility] Pager [phone]  If 7PM-7AM, please contact night-coverage 06/07/2020, 11:01 AM

## 2020-06-08 LAB — CBC WITH DIFFERENTIAL/PLATELET
Abs Immature Granulocytes: 0.1 10*3/uL — ABNORMAL HIGH (ref 0.00–0.07)
Basophils Absolute: 0 10*3/uL (ref 0.0–0.1)
Basophils Relative: 0 %
Eosinophils Absolute: 0 10*3/uL (ref 0.0–0.5)
Eosinophils Relative: 0 %
HCT: 33.9 % — ABNORMAL LOW (ref 36.0–46.0)
Hemoglobin: 11.9 g/dL — ABNORMAL LOW (ref 12.0–15.0)
Immature Granulocytes: 1 %
Lymphocytes Relative: 12 %
Lymphs Abs: 1.3 10*3/uL (ref 0.7–4.0)
MCH: 29.8 pg (ref 26.0–34.0)
MCHC: 35.1 g/dL (ref 30.0–36.0)
MCV: 84.8 fL (ref 80.0–100.0)
Monocytes Absolute: 0.4 10*3/uL (ref 0.1–1.0)
Monocytes Relative: 4 %
Neutro Abs: 8.8 10*3/uL — ABNORMAL HIGH (ref 1.7–7.7)
Neutrophils Relative %: 83 %
Platelets: 221 10*3/uL (ref 150–400)
RBC: 4 MIL/uL (ref 3.87–5.11)
RDW: 12 % (ref 11.5–15.5)
WBC: 10.6 10*3/uL — ABNORMAL HIGH (ref 4.0–10.5)
nRBC: 0 % (ref 0.0–0.2)

## 2020-06-08 LAB — COMPREHENSIVE METABOLIC PANEL
ALT: 24 U/L (ref 0–44)
AST: 23 U/L (ref 15–41)
Albumin: 3 g/dL — ABNORMAL LOW (ref 3.5–5.0)
Alkaline Phosphatase: 51 U/L (ref 38–126)
Anion gap: 7 (ref 5–15)
BUN: 18 mg/dL (ref 6–20)
CO2: 26 mmol/L (ref 22–32)
Calcium: 8.9 mg/dL (ref 8.9–10.3)
Chloride: 105 mmol/L (ref 98–111)
Creatinine, Ser: 0.59 mg/dL (ref 0.44–1.00)
GFR calc Af Amer: >60 mL/min (ref >60)
GFR calc non Af Amer: >60 mL/min (ref >60)
Glucose, Bld: 182 mg/dL — ABNORMAL HIGH (ref 70–99)
Potassium: 3.9 mmol/L (ref 3.5–5.1)
Sodium: 138 mmol/L (ref 135–145)
Total Bilirubin: 0.5 mg/dL (ref 0.3–1.2)
Total Protein: 5.8 g/dL — ABNORMAL LOW (ref 6.5–8.1)

## 2020-06-08 LAB — GLUCOSE, CAPILLARY
Glucose-Capillary: 161 mg/dL — ABNORMAL HIGH (ref 70–99)
Glucose-Capillary: 187 mg/dL — ABNORMAL HIGH (ref 70–99)

## 2020-06-08 LAB — C-REACTIVE PROTEIN: CRP: 1.2 mg/dL — ABNORMAL HIGH (ref <1.0)

## 2020-06-08 LAB — FIBRIN DERIVATIVES D-DIMER (ARMC ONLY): Fibrin derivatives D-dimer (ARMC): 1985.09 ng/mL (FEU) — ABNORMAL HIGH (ref 0.00–499.00)

## 2020-06-08 LAB — FERRITIN: Ferritin: 501 ng/mL — ABNORMAL HIGH (ref 11–307)

## 2020-06-08 MED ORDER — ALBUTEROL SULFATE HFA 108 (90 BASE) MCG/ACT IN AERS: 2.0000 | INHALATION_SPRAY | Freq: Four times a day (QID) | RESPIRATORY_TRACT | 2 refills | Status: AC | PRN

## 2020-06-08 MED ORDER — PREDNISONE 50 MG PO TABS: 50.0000 mg | ORAL_TABLET | Freq: Every day | ORAL | 0 refills | Status: AC

## 2020-06-08 NOTE — Discharge Instructions (Signed)
10 Things You Can Do to Manage Your COVID-19 Symptoms at Home If you have possible or confirmed COVID-19: 1. Stay home from work and school. And stay away from other public places. If you must go out, avoid using any kind of public transportation, ridesharing, or taxis. 2. Monitor your symptoms carefully. If your symptoms get worse, call your healthcare provider immediately. 3. Get rest and stay hydrated. 4. If you have a medical appointment, call the healthcare provider ahead of time and tell them that you have or may have COVID-19. 5. For medical emergencies, call 911 and notify the dispatch personnel that you have or may have COVID-19. 6. Cover your cough and sneezes with a tissue or use the inside of your elbow. 7. Wash your hands often with soap and water for at least 20 seconds or clean your hands with an alcohol-based hand sanitizer that contains at least 60% alcohol. 8. As much as possible, stay in a specific room and away from other people in your home. Also, you should use a separate bathroom, if available. If you need to be around other people in or outside of the home, wear a mask. 9. Avoid sharing personal items with other people in your household, like dishes, towels, and bedding. 10. Clean all surfaces that are touched often, like counters, tabletops, and doorknobs. Use household cleaning sprays or wipes according to the label instructions. cdc.gov/coronavirus 03/28/2019 This information is not intended to replace advice given to you by your health care provider. Make sure you discuss any questions you have with your health care provider. Document Revised: 08/30/2019 Document Reviewed: 08/30/2019 Elsevier Patient Education  2020 Elsevier Inc.  COVID-19: How to Protect Yourself and Others Know how it spreads  There is currently no vaccine to prevent coronavirus disease 2019 (COVID-19).  The best way to prevent illness is to avoid being exposed to this virus.  The virus is  thought to spread mainly from person-to-person. ? Between people who are in close contact with one another (within about 6 feet). ? Through respiratory droplets produced when an infected person coughs, sneezes or talks. ? These droplets can land in the mouths or noses of people who are nearby or possibly be inhaled into the lungs. ? COVID-19 may be spread by people who are not showing symptoms. Everyone should Clean your hands often  Wash your hands often with soap and water for at least 20 seconds especially after you have been in a public place, or after blowing your nose, coughing, or sneezing.  If soap and water are not readily available, use a hand sanitizer that contains at least 60% alcohol. Cover all surfaces of your hands and rub them together until they feel dry.  Avoid touching your eyes, nose, and mouth with unwashed hands. Avoid close contact  Limit contact with others as much as possible.  Avoid close contact with people who are sick.  Put distance between yourself and other people. ? Remember that some people without symptoms may be able to spread virus. ? This is especially important for people who are at higher risk of getting very sick.www.cdc.gov/coronavirus/2019-ncov/need-extra-precautions/people-at-higher-risk.html Cover your mouth and nose with a mask when around others  You could spread COVID-19 to others even if you do not feel sick.  Everyone should wear a mask in public settings and when around people not living in their household, especially when social distancing is difficult to maintain. ? Masks should not be placed on young children under age 2, anyone who   has trouble breathing, or is unconscious, incapacitated or otherwise unable to remove the mask without assistance.  The mask is meant to protect other people in case you are infected.  Do NOT use a facemask meant for a Dietitian.  Continue to keep about 6 feet between yourself and others. The  mask is not a substitute for social distancing. Cover coughs and sneezes  Always cover your mouth and nose with a tissue when you cough or sneeze or use the inside of your elbow.  Throw used tissues in the trash.  Immediately wash your hands with soap and water for at least 20 seconds. If soap and water are not readily available, clean your hands with a hand sanitizer that contains at least 60% alcohol. Clean and disinfect  Clean AND disinfect frequently touched surfaces daily. This includes tables, doorknobs, light switches, countertops, handles, desks, phones, keyboards, toilets, faucets, and sinks. [email or web address]  If surfaces are dirty, clean them: Use detergent or soap and water prior to disinfection.  Then, use a household disinfectant. You can see a list of EPA-registered household disinfectants here. [email or web address] 05/30/2019 This information is not intended to replace advice given to you by your health care provider. Make sure you discuss any questions you have with your health care provider. Document Revised: 06/07/2019 Document Reviewed: 04/05/2019 Elsevier Patient Education  [place].   COVID-19 Frequently Asked Questions COVID-19 (coronavirus disease) is an infection that is caused by a large family of viruses. Some viruses cause illness in people and others cause illness in animals like camels, cats, and bats. In some cases, the viruses that cause illness in animals can spread to humans. Where did the coronavirus come from? In December 2019, [place] told the [facility]) of several cases of lung disease (human respiratory illness). These cases were linked to an open seafood and livestock market in the city of [place]. The link to the seafood and livestock market suggests that the virus may have spread from animals to humans. However, since that first outbreak in December, the virus  has also been shown to spread from person to person. What is the name of the disease and the virus? Disease name Early on, this disease was called novel coronavirus. This is because scientists determined that the disease was caused by a new (novel) respiratory virus. The World Health Organization [facility]) has now named the disease COVID-19, or coronavirus disease. Virus name The virus that causes the disease is called severe acute respiratory syndrome coronavirus 2 (SARS-CoV-2). More information on disease and virus naming World Health Organization [facility]): www.who.int/emergencies/diseases/novel-coronavirus-2019/technical-guidance/naming-the-coronavirus-disease-(covid-2019)-and-the-virus-that-causes-it Who is at risk for complications from coronavirus disease? Some people may be at higher risk for complications from coronavirus disease. This includes older adults and people who have chronic diseases, such as heart disease, diabetes, and lung disease. If you are at higher risk for complications, take these extra precautions:  Stay home as much as possible.  Avoid social gatherings and travel.  Avoid close contact with others. Stay at least 6 ft (2 m) away from others, if possible.  Wash your hands often with soap and water for at least 20 seconds.  Avoid touching your face, mouth, nose, or eyes.  Keep supplies on hand at home, such as food, medicine, and cleaning supplies.  If you must go out in public, wear a cloth face covering or face mask. Make sure your mask covers your nose and mouth. How does coronavirus disease spread? The virus that causes coronavirus disease spreads  easily from person to person (is contagious). You may catch the virus by:  Breathing in droplets from an infected person. Droplets can be spread by a person breathing, speaking, singing, coughing, or sneezing.  Touching something, like a table or a doorknob, that was exposed to the virus (contaminated) and then touching  your mouth, nose, or eyes. Can I get the virus from touching surfaces or objects? There is still a lot that we do not know about the virus that causes coronavirus disease. Scientists are basing a lot of information on what they know about similar viruses, such as:  Viruses cannot generally survive on surfaces for long. They need a human body (host) to survive.  It is more likely that the virus is spread by close contact with people who are sick (direct contact), such as through: ? Shaking hands or hugging. ? Breathing in respiratory droplets that travel through the air. Droplets can be spread by a person breathing, speaking, singing, coughing, or sneezing.  It is less likely that the virus is spread when a person touches a surface or object that has the virus on it (indirect contact). The virus may be able to enter the body if the person touches a surface or object and then touches his or her face, eyes, nose, or mouth. Can a person spread the virus without having symptoms of the disease? It may be possible for the virus to spread before a person has symptoms of the disease, but this is most likely not the main way the virus is spreading. It is more likely for the virus to spread by being in close contact with people who are sick and breathing in the respiratory droplets spread by a person breathing, speaking, singing, coughing, or sneezing. What are the symptoms of coronavirus disease? Symptoms vary from person to person and can range from mild to severe. Symptoms may include:  Fever or chills.  Cough.  Difficulty breathing or feeling short of breath.  Headaches, body aches, or muscle aches.  Runny or stuffy (congested) nose.  Sore throat.  New loss of taste or smell.  Nausea, vomiting, or diarrhea. These symptoms can appear anywhere from 2 to 14 days after you have been exposed to the virus. Some people may not have any symptoms. If you develop symptoms, call your health care  provider. People with severe symptoms may need hospital care. Should I be tested for this virus? Your health care provider will decide whether to test you based on your symptoms, history of exposure, and your risk factors. How does a health care provider test for this virus? Health care providers will collect samples to send for testing. Samples may include:  Taking a swab of fluid from the back of your nose and throat, your nose, or your throat.  Taking fluid from the lungs by having you cough up mucus (sputum) into a sterile cup.  Taking a blood sample. Is there a treatment or vaccine for this virus? Currently, there is no vaccine to prevent coronavirus disease. Also, there are no medicines like antibiotics or antivirals to treat the virus. A person who becomes sick is given supportive care, which means rest and fluids. A person may also relieve his or her symptoms by using over-the-counter medicines that treat sneezing, coughing, and runny nose. These are the same medicines that a person takes for the common cold. If you develop symptoms, call your health care provider. People with severe symptoms may need hospital care. What can I do  to protect myself and my family from this virus?     You can protect yourself and your family by taking the same actions that you would take to prevent the spread of other viruses. Take the following actions:  Wash your hands often with soap and water for at least 20 seconds. If soap and water are not available, use alcohol-based hand sanitizer.  Avoid touching your face, mouth, nose, or eyes.  Cough or sneeze into a tissue, sleeve, or elbow. Do not cough or sneeze into your hand or the air. ? If you cough or sneeze into a tissue, throw it away immediately and wash your hands.  Disinfect objects and surfaces that you frequently touch every day.  Stay away from people who are sick.  Avoid going out in public, follow guidance from your state and local  health authorities.  Avoid crowded indoor spaces. Stay at least 6 ft (2 m) away from others.  If you must go out in public, wear a cloth face covering or face mask. Make sure your mask covers your nose and mouth.  Stay home if you are sick, except to get medical care. Call your health care provider before you get medical care. Your health care provider will tell you how long to stay home.  Make sure your vaccines are up to date. Ask your health care provider what vaccines you need. What should I do if I need to travel? Follow travel recommendations from your local health authority, the CDC, and WHO. Travel information and advice  Centers for Disease Control and Prevention (CDC): BodyEditor.hu  World Health Organization [facility]): ThirdIncome.ca Know the risks and take action to protect your health  You are at higher risk of getting coronavirus disease if you are traveling to areas with an outbreak or if you are exposed to travelers from areas with an outbreak.  Wash your hands often and practice good hygiene to lower the risk of catching or spreading the virus. What should I do if I am sick? General instructions to stop the spread of infection  Wash your hands often with soap and water for at least 20 seconds. If soap and water are not available, use alcohol-based hand sanitizer.  Cough or sneeze into a tissue, sleeve, or elbow. Do not cough or sneeze into your hand or the air.  If you cough or sneeze into a tissue, throw it away immediately and wash your hands.  Stay home unless you must get medical care. Call your health care provider or local health authority before you get medical care.  Avoid public areas. Do not take public transportation, if possible.  If you can, wear a mask if you must go out of the house or if you are in close contact with someone who is not sick. Make sure your  mask covers your nose and mouth. Keep your home clean  Disinfect objects and surfaces that are frequently touched every day. This may include: ? Counters and tables. ? Doorknobs and light switches. ? Sinks and faucets. ? Electronics such as phones, remote controls, keyboards, computers, and tablets.  Wash dishes in hot, soapy water or use a dishwasher. Air-dry your dishes.  Wash laundry in hot water. Prevent infecting other household members  Let healthy household members care for children and pets, if possible. If you have to care for children or pets, wash your hands often and wear a mask.  Sleep in a different bedroom or bed, if possible.  Do not share  personal items, such as razors, toothbrushes, deodorant, combs, brushes, towels, and washcloths. Where to find more information Centers for Disease Control and Prevention (CDC)  Information and news updates: www.cdc.gov/coronavirus/2019-ncov World Health Organization (WHO)  Information and news updates: www.who.int/emergencies/diseases/novel-coronavirus-2019  Coronavirus health topic: www.who.int/health-topics/coronavirus  Questions and answers on COVID-19: www.who.int/news-room/q-a-detail/q-a-coronaviruses  Global tracker: who.sprinklr.com American Academy of Pediatrics (AAP)  Information for families: www.healthychildren.org/English/health-issues/conditions/chest-lungs/Pages/2019-Novel-Coronavirus.aspx The coronavirus situation is changing rapidly. Check your local health authority website or the CDC and WHO websites for updates and news. When should I contact a health care provider?  Contact your health care provider if you have symptoms of an infection, such as fever or cough, and you: ? Have been near anyone who is known to have coronavirus disease. ? Have come into contact with a person who is suspected to have coronavirus disease. ? Have traveled to an area where there is an outbreak of COVID-19. When should I get  emergency medical care?  Get help right away by calling your local emergency services (911 in the [place] if you have: ? Trouble breathing. ? Pain or pressure in your chest. ? Confusion. ? Blue-tinged lips and fingernails. ? Difficulty waking from sleep. ? Symptoms that get worse. Let the emergency medical personnel know if you think you have coronavirus disease. Summary  A new respiratory virus is spreading from person to person and causing COVID-19 (coronavirus disease).  The virus that causes COVID-19 appears to spread easily. It spreads from one person to another through droplets from breathing, speaking, singing, coughing, or sneezing.  Older adults and those with chronic diseases are at higher risk of disease. If you are at higher risk for complications, take extra precautions.  There is currently no vaccine to prevent coronavirus disease. There are no medicines, such as antibiotics or antivirals, to treat the virus.  You can protect yourself and your family by washing your hands often, avoiding touching your face, and covering your coughs and sneezes. This information is not intended to replace advice given to you by your health care provider. Make sure you discuss any questions you have with your health care provider. Document Revised: 07/13/2019 Document Reviewed: 01/09/2019 Elsevier Patient Education  2020 Elsevier Inc.  

## 2020-06-08 NOTE — Plan of Care (Signed)
  Problem: Education: Goal: Knowledge of risk factors and measures for prevention of condition will improve Outcome: Adequate for Discharge   

## 2020-06-08 NOTE — Discharge Summary (Signed)
Physician Discharge Summary  Carolyn Obrien EYC:[id number] DOB: [birth year] DOA: 06/06/2020  PCP: Clent Jacks, PA-C  Admit date: 06/06/2020 Discharge date: 06/08/2020  Admitted From: Home Disposition:  Home  Recommendations for Outpatient Follow-up:  1. Follow up with PCP in 1-2 weeks   Home Health:No Equipment/Devices:None  Discharge Condition:Stable CODE STATUS:Full Diet recommendation: Heart Healthy  Brief/Interim Summary: 59 year old female in relatively good level health, known history of prediabetes presented with symptoms concerning for Covid pneumonia. Presentation patient was hypoxic requiring supplemental oxygen and tachypneic. Symptoms slowly improving with IV steroids and supplemental oxygen. At time my evaluation patient remains on 2 L. She is mentating clearly. Normal work of breathing  9/11: Patient seen and examined.  Respiratory status about stable from yesterday.  Remains on 2 L nasal cannula.  Reports that she was able to get up and brush her teeth but became short of breath when doing so.  Hemodynamically stable.  Mentating clearly.  Normal work of breathing.  9/12: Patient seen and examined on the day of discharge.  Wean from supplemental oxygen.  Ambulated prior to discharge and did not desaturate.  Will complete course of steroids at time of discharge.  Total 10-day course prescribed.  Also prescribed albuterol MDI as needed and Tessalon Perles as needed.  Post discharge Covid safety instructions provided to patient at bedside.  All questions answered.  Patient expressed understanding.  Discharged in stable condition  Discharge Diagnoses:  Active Problems:   Pneumonia due to COVID-19 virus Multifocal pneumonia secondary to COVID-19 Mild acute hypoxic respiratory failure secondary to above Patient is unvaccinated Appears to be improving over interval On 2 L nasal cannula Downtrending inflammatory markers Wean from supplemental oxygen Received 4 out of 5  remdesivir doses Will complete 10-day course of steroids at discharge Albuterol MDI prescribed.  Tessalon Perles prescribed.  To be used as needed Post discharge Covid safety instructions included in discharge packet and provided to patient at time of discharge.  Prediabetes Monitor fingersticks  Glaucoma Ophthalmic GTT   Discharge Instructions  Discharge Instructions    Diet - low sodium heart healthy   Complete by: As directed    Increase activity slowly   Complete by: As directed    MyChart COVID-19 home monitoring program   Complete by: Jun 08, 2020    Is the patient willing to use the MyChart Mobile App for home monitoring?: Yes   Temperature monitoring   Complete by: Jun 08, 2020    After how many days would you like to receive a notification of this patient's flowsheet entries?: 1     Allergies as of 06/08/2020   No Known Allergies     Medication List    TAKE these medications   acetaminophen 500 MG tablet Commonly known as: TYLENOL Take 500-1,000 mg by mouth every 6 (six) hours as needed.   albuterol 108 (90 Base) MCG/ACT inhaler Commonly known as: VENTOLIN HFA Inhale 2 puffs into the lungs every 6 (six) hours as needed for wheezing or shortness of breath.   ascorbic acid 500 MG tablet Commonly known as: VITAMIN C Take 500 mg by mouth daily.   chlorpheniramine-HYDROcodone 10-8 MG/5ML Suer Commonly known as: TUSSIONEX Take 5 mLs by mouth at bedtime as needed.   cholecalciferol 25 MCG (1000 UNIT) tablet Commonly known as: VITAMIN D3 Take 5,000 Units by mouth daily.   ibuprofen 200 MG tablet Commonly known as: ADVIL Take 200-400 mg by mouth every 6 (six) hours as needed.   latanoprost 0.005 % ophthalmic  solution Commonly known as: XALATAN Place 1 drop into both eyes at bedtime.   predniSONE 50 MG tablet Commonly known as: DELTASONE Take 1 tablet (50 mg total) by mouth daily for 8 days.   zinc sulfate 220 (50 Zn) MG capsule Take 220 mg by mouth  daily.       Follow-up Information    Clent Jacks, [place]. Schedule an appointment as soon as possible for a visit in 1 week(s).   Specialty: Physician Assistant Contact information: [address] [facility] [place] [address] [phone]              No Known Allergies  Consultations:  None   Procedures/Studies: DG Chest 2 View  Result Date: 06/05/2020 CLINICAL DATA:  COVID positive patient with cough and fever. Weakness and dizziness. EXAM: CHEST - 2 VIEW COMPARISON:  None. FINDINGS: Heart size and pulmonary vascularity are normal. Patchy nodular infiltrates in the right lung and left lower lung with peribronchial thickening. Appearances are compatible with COVID pneumonia. No pleural effusions. No pneumothorax. Mediastinal contours appear intact. IMPRESSION: Patchy nodular infiltrates in the right lung and left lower lung compatible with COVID pneumonia. Electronically Signed   By: Burman Nieves M.D.   On: 06/05/2020 22:09   CT ANGIO CHEST PE W OR WO CONTRAST  Result Date: 06/06/2020 CLINICAL DATA:  Shortness of breath, COVID-19 positive EXAM: CT ANGIOGRAPHY CHEST WITH CONTRAST TECHNIQUE: Multidetector CT imaging of the chest was performed using the standard protocol during bolus administration of intravenous contrast. Multiplanar CT image reconstructions and MIPs were obtained to evaluate the vascular anatomy. CONTRAST:  OMNIPAQUE IOHEXOL 350 MG/ML SOLN COMPARISON:  Chest x-ray 06/05/2020 FINDINGS: Cardiovascular: Satisfactory opacification of the pulmonary arteries to the segmental level. No evidence of pulmonary embolism. Thoracic aorta is normal in course and caliber. Normal heart size. No pericardial effusion. Mediastinum/Nodes: No enlarged mediastinal, hilar, or axillary lymph nodes. Thyroid gland, trachea, and esophagus demonstrate no significant findings. Lungs/Pleura: Scattered patchy airspace consolidations throughout both lungs in a predominantly  peripheral distribution. Findings are most pronounced within the right upper lobe. No pleural effusion or pneumothorax. Upper Abdomen: Small sliding type hiatal hernia. 1.0 cm low-density left adrenal gland nodule with 7 HU of internal density most likely representing an adrenal adenoma. No acute findings within the visualized upper abdomen. Musculoskeletal: Intraosseous hemangioma noted within the T9 and T10 vertebral bodies. No acute osseous findings. No suspicious bone lesion. No chest wall abnormality. Review of the MIP images confirms the above findings. IMPRESSION: 1. No evidence of acute pulmonary embolism. 2. Scattered patchy airspace consolidations throughout both lungs in a predominantly peripheral distribution, consistent with multifocal pneumonia in the setting of known COVID-19 infection. 3. Small sliding type hiatal hernia. 4. 1.0 cm left adrenal gland nodule favored to represent a benign adrenal adenoma. No dedicated follow-up imaging is recommended. This recommendation follows ACR consensus guidelines: Management of Incidental Adrenal Masses: A White Paper of the ACR Incidental Findings Committee. J Am Coll Radiol 2017;14:1038-1044. Electronically Signed   By: Duanne Guess D.O.   On: 06/06/2020 08:27    (Echo, Carotid, EGD, Colonoscopy, ERCP)    Subjective: Patient seen and examined the day of discharge.  Stable, no distress.  Afebrile x48 hours.  Stable for discharge home at this time.  Discharge Exam: Vitals:   06/08/20 0426 06/08/20 0827  BP: 111/70 106/83  Pulse: (!) 52 62  Resp: 18 20  Temp: 97.7 F (36.5 C) 98.2 F (36.8 C)  SpO2: 96% 97%  Vitals:   06/07/20 1638 06/07/20 2128 06/08/20 0426 06/08/20 0827  BP: 129/71 122/70 111/70 106/83  Pulse: 88 (!) 54 (!) 52 62  Resp: (!) 22 18 18 20   Temp: 98.1 F (36.7 C) 98.6 F (37 C) 97.7 F (36.5 C) 98.2 F (36.8 C)  TempSrc: Oral Oral Oral Oral  SpO2: 97% 100% 96% 97%  Weight:      Height:        General: Pt is  alert, awake, not in acute distress Cardiovascular: RRR, S1/S2 +, no rubs, no gallops Respiratory: CTA bilaterally, no wheezing, no rhonchi Abdominal: Soft, NT, ND, bowel sounds + Extremities: no edema, no cyanosis    The results of significant diagnostics from this hospitalization (including imaging, microbiology, ancillary and laboratory) are listed below for reference.     Microbiology: No results found for this or any previous visit (from the past 240 hour(s)).   Labs: BNP (last 3 results) Recent Labs    06/06/20 0357  BNP 30.3   Basic Metabolic Panel: Recent Labs  Lab 06/05/20 2141 06/07/20 0544 06/08/20 0636  NA 138 139 138  K 3.8 3.7 3.9  CL 97* 104 105  CO2 30 26 26   GLUCOSE 108* 172* 182*  BUN 13 16 18   CREATININE 0.69 0.55 0.59  CALCIUM 9.0 9.0 8.9   Liver Function Tests: Recent Labs  Lab 06/05/20 2141 06/07/20 0544 06/08/20 0636  AST 38 30 23  ALT 28 27 24   ALKPHOS 59 56 51  BILITOT 0.8 0.6 0.5  PROT 7.0 6.1* 5.8*  ALBUMIN 3.6 3.0* 3.0*   No results for input(s): LIPASE, AMYLASE in the last 168 hours. No results for input(s): AMMONIA in the last 168 hours. CBC: Recent Labs  Lab 06/05/20 2141 06/07/20 0544 06/08/20 0636  WBC 4.5 6.2 10.6*  NEUTROABS 2.6 4.5 8.8*  HGB 13.3 12.0 11.9*  HCT 39.3 35.0* 33.9*  MCV 88.7 86.4 84.8  PLT 143* 166 221   Cardiac Enzymes: Recent Labs  Lab 06/05/20 2141  CKTOTAL 60   BNP: Invalid input(s): POCBNP CBG: Recent Labs  Lab 06/07/20 1217 06/07/20 1648 06/07/20 2125 06/08/20 0824 06/08/20 1156  GLUCAP 239* 131* 155* 161* 187*   D-Dimer No results for input(s): DDIMER in the last 72 hours. Hgb A1c Recent Labs    06/05/20 2141  HGBA1C 6.1*   Lipid Profile No results for input(s): CHOL, HDL, LDLCALC, TRIG, CHOLHDL, LDLDIRECT in the last 72 hours. Thyroid function studies No results for input(s): TSH, T4TOTAL, T3FREE, THYROIDAB in the last 72 hours.  Invalid input(s): FREET3 Anemia  work up Recent Labs    06/07/20 0544 06/08/20 0636  FERRITIN 513* 501*   Urinalysis No results found for: COLORURINE, APPEARANCEUR, LABSPEC, PHURINE, GLUCOSEU, HGBUR, BILIRUBINUR, KETONESUR, PROTEINUR, UROBILINOGEN, NITRITE, LEUKOCYTESUR Sepsis Labs Invalid input(s): PROCALCITONIN,  WBC,  LACTICIDVEN Microbiology No results found for this or any previous visit (from the past 240 hour(s)).   Time coordinating discharge: Over 30 minutes  SIGNED:   08/08/20, MD  [facility] 06/08/2020, 11:58 AM Pager   If 7PM-7AM, please contact night-coverage

## 2020-06-08 NOTE — Progress Notes (Signed)
Ambulated off oxygen and maintained 92 - 100%

## 2020-06-08 NOTE — Progress Notes (Signed)
Patient with decreased heart rate with range of 44-48 while asleep. Manuela Schwartz notified. Patient awakens easily and heart rate increases. Patient denies chest pain or discomfort.

## 2021-10-28 IMAGING — CR DG CHEST 2V
1 series · 2 of 2 positions shown · non-contrast
Comparison: None.

CLINICAL DATA: COVID positive patient with cough and fever.
Weakness and dizziness.

EXAM:
CHEST - 2 VIEW

[Series 1: dg chest 2 view · 0.14mm/px · 2 of 2 slices shown]
[im 1/2]
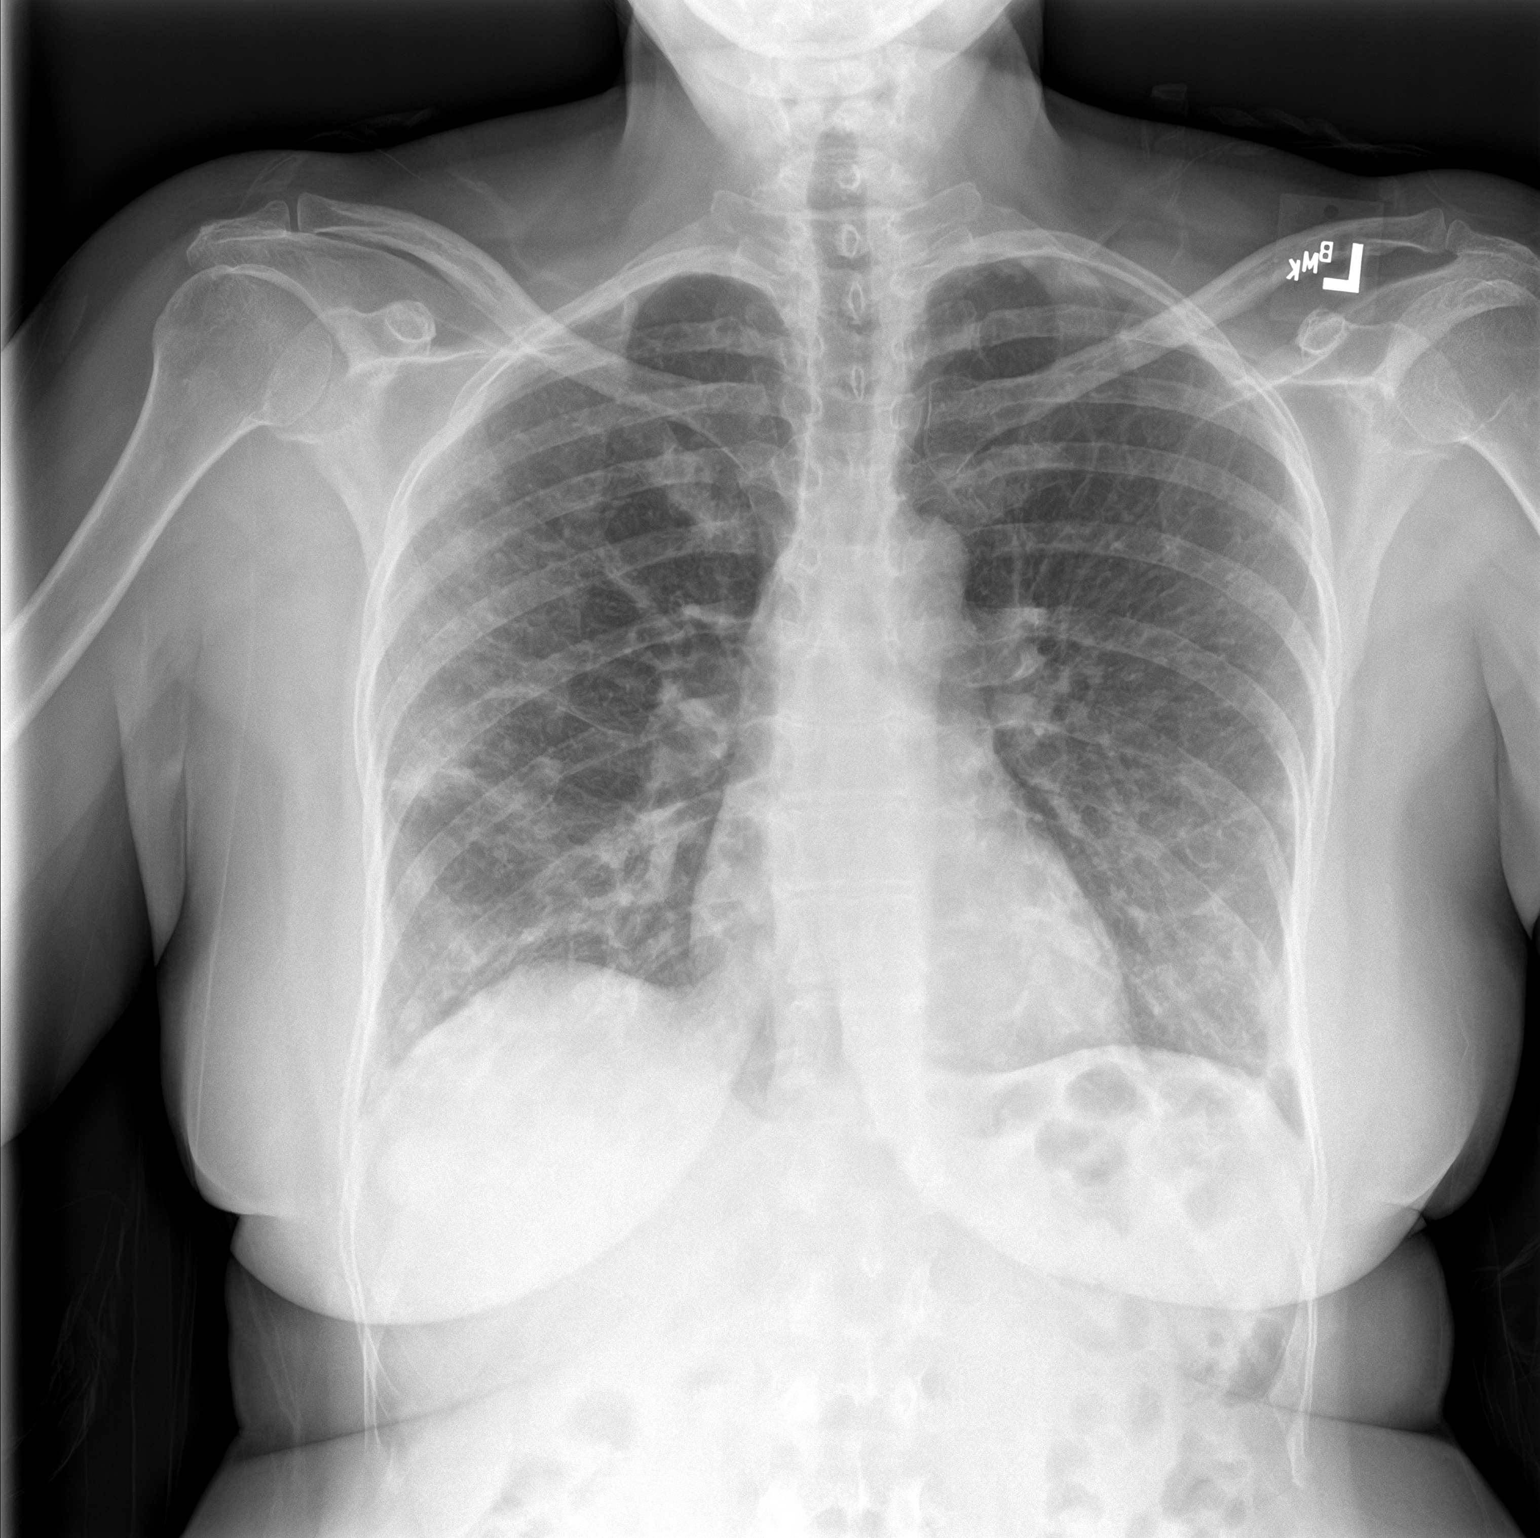
[im 2/2]
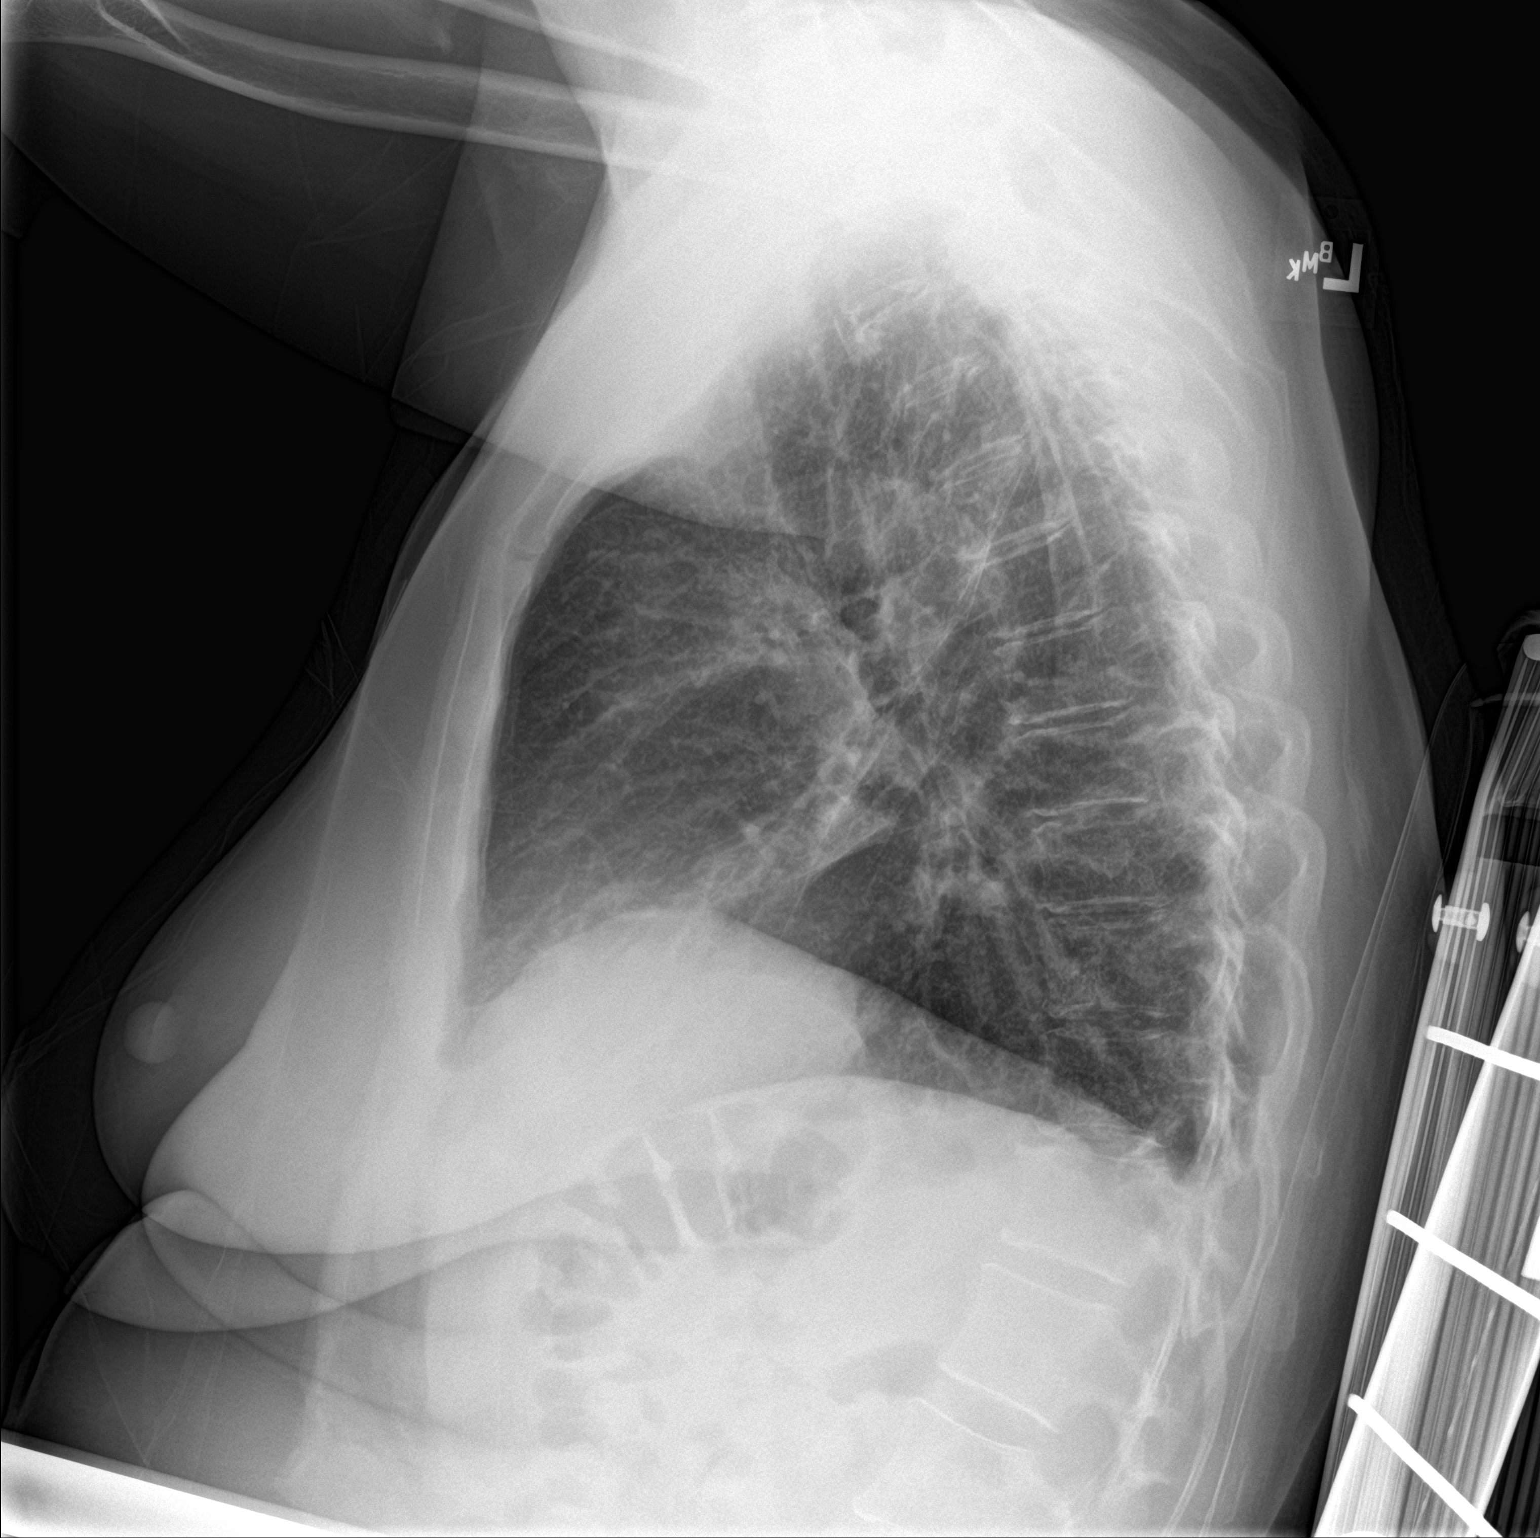

[2 of 2 positions shown; findings below may reference images not displayed]

FINDINGS: Heart size and pulmonary vascularity are normal. Patchy nodular
infiltrates in the right lung and left lower lung with peribronchial
thickening. Appearances are compatible with COVID pneumonia. No
pleural effusions. No pneumothorax. Mediastinal contours appear
intact.
IMPRESSION: Patchy nodular infiltrates in the right lung and left lower lung
compatible with COVID pneumonia.

## 2023-10-28 DIAGNOSIS — R350 Frequency of micturition: Secondary | ICD-10-CM | POA: Diagnosis not present

## 2023-10-28 DIAGNOSIS — R300 Dysuria: Secondary | ICD-10-CM | POA: Diagnosis not present

## 2023-12-14 DIAGNOSIS — M25561 Pain in right knee: Secondary | ICD-10-CM | POA: Diagnosis not present

## 2023-12-20 ENCOUNTER — Ambulatory Visit (INDEPENDENT_AMBULATORY_CARE_PROVIDER_SITE_OTHER): Payer: Self-pay | Admitting: Pediatrics

## 2023-12-20 ENCOUNTER — Other Ambulatory Visit (HOSPITAL_COMMUNITY)
Admission: RE | Admit: 2023-12-20 | Discharge: 2023-12-20 | Disposition: A | Discharge status: Home / Self Care | Source: Ambulatory Visit | Attending: Pediatrics | Admitting: Pediatrics

## 2023-12-20 ENCOUNTER — Encounter: Payer: Self-pay | Admitting: Pediatrics

## 2023-12-20 VITALS — BP 117/75 | HR 64 | Temp 98.3°F | Ht 63.0 in | Wt 157.6 lb

## 2023-12-20 DIAGNOSIS — Z0000 Encounter for general adult medical examination without abnormal findings: Secondary | ICD-10-CM

## 2023-12-20 DIAGNOSIS — Z1330 Encounter for screening examination for mental health and behavioral disorders, unspecified: Secondary | ICD-10-CM | POA: Diagnosis not present

## 2023-12-20 DIAGNOSIS — Z131 Encounter for screening for diabetes mellitus: Secondary | ICD-10-CM

## 2023-12-20 DIAGNOSIS — Z1211 Encounter for screening for malignant neoplasm of colon: Secondary | ICD-10-CM

## 2023-12-20 DIAGNOSIS — L900 Lichen sclerosus et atrophicus: Secondary | ICD-10-CM | POA: Diagnosis not present

## 2023-12-20 DIAGNOSIS — T7840XS Allergy, unspecified, sequela: Secondary | ICD-10-CM

## 2023-12-20 DIAGNOSIS — Z124 Encounter for screening for malignant neoplasm of cervix: Secondary | ICD-10-CM | POA: Insufficient documentation

## 2023-12-20 DIAGNOSIS — Z7689 Persons encountering health services in other specified circumstances: Secondary | ICD-10-CM | POA: Diagnosis not present

## 2023-12-20 DIAGNOSIS — T7840XA Allergy, unspecified, initial encounter: Secondary | ICD-10-CM | POA: Insufficient documentation

## 2023-12-20 DIAGNOSIS — Z13220 Encounter for screening for lipoid disorders: Secondary | ICD-10-CM

## 2023-12-20 DIAGNOSIS — Z1231 Encounter for screening mammogram for malignant neoplasm of breast: Secondary | ICD-10-CM

## 2023-12-20 MED ORDER — CLOBETASOL PROPIONATE 0.05 % EX OINT: 1.0000 | TOPICAL_OINTMENT | Freq: Two times a day (BID) | CUTANEOUS | 0 refills | Status: AC

## 2023-12-20 MED ORDER — MONTELUKAST SODIUM 10 MG PO TABS: 10.0000 mg | ORAL_TABLET | Freq: Every day | ORAL | 3 refills | Status: AC

## 2023-12-20 NOTE — Patient Instructions (Signed)
 Don't forget to schedule your mammogram in May!

## 2023-12-20 NOTE — Progress Notes (Unsigned)
 BP 117/75   Pulse 64   Temp 98.3 F (36.8 C) (Oral)   Ht 5\' 3"  (1.6 m)   Wt 157 lb 9.6 oz (71.5 kg)   SpO2 99%   BMI 27.92 kg/m    Annual Physical Exam - Female  Subjective:   CC: No chief complaint on file.   Carolyn Obrien is a 63 y.o. female patient here for a preventative health maintenance exam{Blank Single :19197::" and has no acute complaints.",". Additional topics discussed include:"}  Health Habits: DIET: {Desc; diets:16563} EXERCISE: *** times/week on average, activities include {misc; exercise types:16438} DENTAL EXAM: {UTDSTATUS:31041} EYE EXAM: {UTDSTATUS:31041}                       Relevant Gynecologic History LMP: No LMP recorded. Patient is postmenopausal.  Menstrual Status: {Menopause:31378}, Flow {Misc; menses description:16152} PAP History:  No Cervical Cancer Screening results to display.  History abnormal PAP: {yes/no:20286}  Sexual activity: {sexual partners:315163} Family history breast, ovarian cancer: {yes/no:20286} Domestic Violence Screen, feels safe at home: {yes/no:20286}  family history includes Diabetes in her father.  Social History   Tobacco Use   Smoking status: Never   Smokeless tobacco: Never  Vaping Use   Vaping status: Never Used  Substance Use Topics   Alcohol use: Not Currently   Drug use: Not Currently   Social History   Social History Narrative   Not on file    Social drivers questionnaire is reviewed and is positive for : {SDOH Challenges:24934}. Follow up: {sdoh follow up:67204::"None"}  Depression Screening:     12/20/2023    3:24 PM  Depression screen PHQ 2/9  Decreased Interest 0  Down, Depressed, Hopeless 0  PHQ - 2 Score 0  Altered sleeping 0  Tired, decreased energy 0  Change in appetite 0  Feeling bad or failure about yourself  0  Trouble concentrating 0  Moving slowly or fidgety/restless 0  Suicidal thoughts 0  PHQ-9 Score 0  Difficult doing work/chores Not difficult at all        12/20/2023    3:24 PM  GAD 7 : Generalized Anxiety Score  Nervous, Anxious, on Edge 0  Control/stop worrying 0  Worry too much - different things 0  Trouble relaxing 0  Restless 0  Easily annoyed or irritable 0  Afraid - awful might happen 0  Total GAD 7 Score 0  Anxiety Difficulty Not difficult at all    Mental Health Plan: {[phone]}  Self Management Goals  Goals   None     Health Maintenance Colon Cancer Screening : {UTDSTATUS:31041} Mammogram : {UTDSTATUS:31041} DXA scan : {UTDSTATUS:31041} Immunizations : {Immunizations:5306}  Review of Systems See HPI for relevant ROS.  Outpatient Medications Prior to Visit  Medication Sig Dispense Refill   acetaminophen (TYLENOL) 500 MG tablet Take 500-1,000 mg by mouth every 6 (six) hours as needed.     latanoprost (XALATAN) 0.005 % ophthalmic solution Place 1 drop into both eyes at bedtime.     predniSONE (DELTASONE) 20 MG tablet Take 1 tablet twice daily for 5 days, then 1 tablet once daily for 5 days.  Take with food.     albuterol (VENTOLIN HFA) 108 (90 Base) MCG/ACT inhaler Inhale 2 puffs into the lungs every 6 (six) hours as needed for wheezing or shortness of breath. (Patient not taking: Reported on 12/20/2023) 8 g 2   ascorbic acid (VITAMIN C) 500 MG tablet Take 500 mg by mouth daily. (Patient not taking: Reported on  12/20/2023)     chlorpheniramine-HYDROcodone (TUSSIONEX) 10-8 MG/5ML SUER Take 5 mLs by mouth at bedtime as needed. (Patient not taking: Reported on 12/20/2023)     cholecalciferol (VITAMIN D3) 25 MCG (1000 UNIT) tablet Take 5,000 Units by mouth daily. (Patient not taking: Reported on 12/20/2023)     ibuprofen (ADVIL) 200 MG tablet Take 200-400 mg by mouth every 6 (six) hours as needed. (Patient not taking: Reported on 12/20/2023)     latanoprost (XALATAN) 0.005 % ophthalmic solution Apply to eye. (Patient not taking: Reported on 12/20/2023)     zinc sulfate 220 (50 Zn) MG capsule Take 220 mg by mouth daily.  (Patient not taking: Reported on 12/20/2023)     montelukast (SINGULAIR) 10 MG tablet Take 10 mg by mouth at bedtime.     No facility-administered medications prior to visit.     Patient Active Problem List   Diagnosis Date Noted   Lichen sclerosus 12/20/2023   Allergies 12/20/2023   Pneumonia due to COVID-19 virus 06/06/2020    Objective:   Vitals:   12/20/23 1501  BP: 117/75  Pulse: 64  Temp: 98.3 F (36.8 C)  Height: 5\' 3"  (1.6 m)  Weight: 157 lb 9.6 oz (71.5 kg)  SpO2: 99%  TempSrc: Oral  BMI (Calculated): 27.92    Body mass index is 27.92 kg/m.  Physical Exam ***   Assessment and Plan:   Annual physical exam -     CBC -     Comprehensive metabolic panel  Lichen sclerosus -     Clobetasol Propionate; Apply 1 Application topically 2 (two) times daily.  Dispense: 30 g; Refill: 0  Allergy, sequela -     Montelukast Sodium; Take 1 tablet (10 mg total) by mouth at bedtime.  Dispense: 90 tablet; Refill: 3  Encounter to establish care  Encounter for behavioral health screening  Diabetes mellitus screening -     Hemoglobin A1c  Lipid screening -     Lipid panel  Screen for colon cancer  Encounter for screening mammogram for malignant neoplasm of breast  Screening for cervical cancer -     Cytology - PAP     This plan was discussed with the patient and questions were answered. There were no further concerns.  Follow up as indicated, or sooner should any new problems arise, if conditions worsen, or if they are otherwise concerned.   See patient instructions for additional information.  Jackolyn Confer, MD  Family Medicine      No future appointments.

## 2023-12-21 ENCOUNTER — Encounter: Payer: Self-pay | Admitting: Pediatrics

## 2023-12-21 LAB — COMPREHENSIVE METABOLIC PANEL
ALT: 18 IU/L (ref 0–32)
AST: 16 IU/L (ref 0–40)
Albumin: 4.5 g/dL (ref 3.9–4.9)
Alkaline Phosphatase: 73 IU/L (ref 44–121)
BUN/Creatinine Ratio: 23 (ref 12–28)
BUN: 18 mg/dL (ref 8–27)
Bilirubin Total: 0.3 mg/dL (ref 0.0–1.2)
CO2: 28 mmol/L (ref 20–29)
Calcium: 10.1 mg/dL (ref 8.7–10.3)
Chloride: 98 mmol/L (ref 96–106)
Creatinine, Ser: 0.79 mg/dL (ref 0.57–1.00)
Globulin, Total: 2.4 g/dL (ref 1.5–4.5)
Glucose: 93 mg/dL (ref 70–99)
Potassium: 3.8 mmol/L (ref 3.5–5.2)
Sodium: 139 mmol/L (ref 134–144)
Total Protein: 6.9 g/dL (ref 6.0–8.5)
eGFR: 85 mL/min/{1.73_m2} (ref >59)

## 2023-12-21 LAB — LIPID PANEL
Chol/HDL Ratio: 2.5 ratio (ref 0.0–4.4)
Cholesterol, Total: 226 mg/dL — ABNORMAL HIGH (ref 100–199)
HDL: 90 mg/dL (ref >39)
LDL Chol Calc (NIH): 119 mg/dL — ABNORMAL HIGH (ref 0–99)
Triglycerides: 97 mg/dL (ref 0–149)
VLDL Cholesterol Cal: 17 mg/dL (ref 5–40)

## 2023-12-21 LAB — CBC
Hematocrit: 45.5 % (ref 34.0–46.6)
Hemoglobin: 14.9 g/dL (ref 11.1–15.9)
MCH: 29.9 pg (ref 26.6–33.0)
MCHC: 32.7 g/dL (ref 31.5–35.7)
MCV: 91 fL (ref 79–97)
Platelets: 199 10*3/uL (ref 150–450)
RBC: 4.99 x10E6/uL (ref 3.77–5.28)
RDW: 12.1 % (ref 11.7–15.4)
WBC: 10.3 10*3/uL (ref 3.4–10.8)

## 2023-12-21 LAB — HEMOGLOBIN A1C
Est. average glucose Bld gHb Est-mCnc: 126 mg/dL
Hgb A1c MFr Bld: 6 % — ABNORMAL HIGH (ref 4.8–5.6)

## 2023-12-21 NOTE — Assessment & Plan Note (Signed)
 Refills requested.

## 2023-12-22 DIAGNOSIS — M1711 Unilateral primary osteoarthritis, right knee: Secondary | ICD-10-CM | POA: Diagnosis not present

## 2023-12-22 DIAGNOSIS — M25561 Pain in right knee: Secondary | ICD-10-CM | POA: Diagnosis not present

## 2023-12-22 LAB — CYTOLOGY - PAP
Comment: NEGATIVE
Diagnosis: NEGATIVE
High risk HPV: NEGATIVE

## 2024-01-05 NOTE — Telephone Encounter (Unsigned)
 Copied from CRM [id number]. Topic: General - Billing Inquiry >> Jan 05, 2024 11:14 AM Corinna Lines S wrote: Reason for CRM: PATIENT WAS TRANSFERRED OVER FROM [employer] REGARDING A BIILL THE PATIENT RECVD FOR $441.00 FOR A PHYSICAL ON 12/20/23. PATIENT STATED THAT THE PROVIDER IS OON WITH [employer].PATIENT WOULD LIKE A CALL BACK

## 2024-02-03 DIAGNOSIS — H40023 Open angle with borderline findings, high risk, bilateral: Secondary | ICD-10-CM | POA: Diagnosis not present

## 2024-02-03 DIAGNOSIS — H2513 Age-related nuclear cataract, bilateral: Secondary | ICD-10-CM | POA: Diagnosis not present

## 2024-02-24 ENCOUNTER — Other Ambulatory Visit: Payer: Self-pay | Admitting: Pediatrics

## 2024-02-24 DIAGNOSIS — Z1231 Encounter for screening mammogram for malignant neoplasm of breast: Secondary | ICD-10-CM

## 2024-02-28 ENCOUNTER — Inpatient Hospital Stay
Admission: RE | Admit: 2024-02-28 | Discharge: 2024-02-28 | Disposition: A | Discharge status: Home / Self Care | Payer: Self-pay | Source: Ambulatory Visit | Attending: Pediatrics | Admitting: Pediatrics

## 2024-02-28 ENCOUNTER — Other Ambulatory Visit: Payer: Self-pay | Admitting: *Deleted

## 2024-02-28 DIAGNOSIS — Z1231 Encounter for screening mammogram for malignant neoplasm of breast: Secondary | ICD-10-CM

## 2024-03-07 ENCOUNTER — Ambulatory Visit
Admission: RE | Admit: 2024-03-07 | Discharge: 2024-03-07 | Disposition: A | Discharge status: Home / Self Care | Source: Ambulatory Visit | Attending: Pediatrics | Admitting: Pediatrics

## 2024-03-07 DIAGNOSIS — Z1231 Encounter for screening mammogram for malignant neoplasm of breast: Secondary | ICD-10-CM | POA: Diagnosis not present

## 2024-03-13 ENCOUNTER — Ambulatory Visit: Payer: Self-pay | Admitting: Pediatrics

## 2024-12-19 ENCOUNTER — Encounter: Admitting: Pediatrics
